# Patient Record
Sex: Female | Born: 1935 | Race: Black or African American | Hispanic: No | Smoking: Never smoker
Health system: Southern US, Community
[De-identification: ages and names within clinical notes are randomized; demographics above are authoritative.]

## PROBLEM LIST (undated history)

## (undated) DIAGNOSIS — R519 Headache, unspecified: Secondary | ICD-10-CM

## (undated) DIAGNOSIS — H269 Unspecified cataract: Secondary | ICD-10-CM

## (undated) DIAGNOSIS — M129 Arthropathy, unspecified: Secondary | ICD-10-CM

## (undated) DIAGNOSIS — M1990 Unspecified osteoarthritis, unspecified site: Secondary | ICD-10-CM

## (undated) DIAGNOSIS — I10 Essential (primary) hypertension: Secondary | ICD-10-CM

## (undated) DIAGNOSIS — E669 Obesity, unspecified: Secondary | ICD-10-CM

## (undated) DIAGNOSIS — R51 Headache: Secondary | ICD-10-CM

## (undated) DIAGNOSIS — D649 Anemia, unspecified: Secondary | ICD-10-CM

## (undated) DIAGNOSIS — J00 Acute nasopharyngitis [common cold]: Secondary | ICD-10-CM

## (undated) DIAGNOSIS — J309 Allergic rhinitis, unspecified: Secondary | ICD-10-CM

## (undated) HISTORY — DX: Unspecified cataract: H26.9

## (undated) HISTORY — DX: Allergic rhinitis, unspecified: J30.9

## (undated) HISTORY — PX: EYE SURGERY: SHX253

## (undated) HISTORY — PX: CATARACT EXTRACTION: SUR2

## (undated) HISTORY — DX: Arthropathy, unspecified: M12.9

## (undated) HISTORY — DX: Unspecified osteoarthritis, unspecified site: M19.90

## (undated) HISTORY — DX: Acute nasopharyngitis (common cold): J00

## (undated) HISTORY — DX: Obesity, unspecified: E66.9

## (undated) HISTORY — DX: Essential (primary) hypertension: I10

---

## 1968-11-25 HISTORY — PX: ABDOMINAL HYSTERECTOMY: SHX81

## 2005-10-17 ENCOUNTER — Emergency Department: Payer: Self-pay | Admitting: Emergency Medicine

## 2006-09-19 ENCOUNTER — Other Ambulatory Visit: Payer: Self-pay

## 2006-09-19 ENCOUNTER — Ambulatory Visit: Payer: Self-pay | Admitting: Ophthalmology

## 2006-09-24 ENCOUNTER — Ambulatory Visit: Payer: Self-pay | Admitting: Ophthalmology

## 2007-05-02 ENCOUNTER — Emergency Department: Payer: Self-pay | Admitting: Emergency Medicine

## 2007-05-02 ENCOUNTER — Other Ambulatory Visit: Payer: Self-pay

## 2008-05-14 ENCOUNTER — Emergency Department: Payer: Self-pay | Admitting: Internal Medicine

## 2008-05-14 ENCOUNTER — Other Ambulatory Visit: Payer: Self-pay

## 2009-01-05 ENCOUNTER — Ambulatory Visit: Payer: Self-pay | Admitting: Family

## 2011-01-01 ENCOUNTER — Emergency Department: Payer: Self-pay | Admitting: Emergency Medicine

## 2011-08-07 ENCOUNTER — Other Ambulatory Visit: Payer: Self-pay | Admitting: Internal Medicine

## 2011-08-08 MED ORDER — CELECOXIB 100 MG PO CAPS: 100.0000 mg | ORAL_CAPSULE | Freq: Two times a day (BID) | ORAL | Status: AC

## 2011-09-06 ENCOUNTER — Encounter: Payer: Self-pay | Admitting: Internal Medicine

## 2012-04-01 ENCOUNTER — Ambulatory Visit: Payer: Self-pay | Admitting: Internal Medicine

## 2012-10-13 ENCOUNTER — Ambulatory Visit: Payer: Self-pay | Admitting: Internal Medicine

## 2012-10-16 DIAGNOSIS — R9431 Abnormal electrocardiogram [ECG] [EKG]: Secondary | ICD-10-CM | POA: Insufficient documentation

## 2013-01-29 ENCOUNTER — Emergency Department: Payer: Self-pay | Admitting: Emergency Medicine

## 2013-01-29 LAB — COMPREHENSIVE METABOLIC PANEL
Anion Gap: 4 — ABNORMAL LOW (ref 7–16)
BUN: 13 mg/dL (ref 7–18)
Bilirubin,Total: 0.3 mg/dL (ref 0.2–1.0)
Calcium, Total: 8.8 mg/dL (ref 8.5–10.1)
Chloride: 110 mmol/L — ABNORMAL HIGH (ref 98–107)
Creatinine: 0.6 mg/dL (ref 0.60–1.30)
Glucose: 120 mg/dL — ABNORMAL HIGH (ref 65–99)
Osmolality: 281 (ref 275–301)
Potassium: 3.7 mmol/L (ref 3.5–5.1)
SGOT(AST): 19 U/L (ref 15–37)
SGPT (ALT): 18 U/L (ref 12–78)
Total Protein: 7.7 g/dL (ref 6.4–8.2)

## 2013-01-29 LAB — URINALYSIS, COMPLETE
Bacteria: NONE SEEN
Glucose,UR: NEGATIVE mg/dL (ref 0–75)
Nitrite: NEGATIVE
Specific Gravity: 1.023 (ref 1.003–1.030)
WBC UR: 2 /HPF (ref 0–5)

## 2013-01-29 LAB — CBC
HCT: 35.9 % (ref 35.0–47.0)
HGB: 12.5 g/dL (ref 12.0–16.0)
MCH: 33.2 pg (ref 26.0–34.0)
RBC: 3.76 10*6/uL — ABNORMAL LOW (ref 3.80–5.20)

## 2013-01-29 LAB — TROPONIN I: Troponin-I: <0.02 ng/mL

## 2013-03-05 ENCOUNTER — Ambulatory Visit: Payer: Self-pay | Admitting: Internal Medicine

## 2013-03-10 DIAGNOSIS — R011 Cardiac murmur, unspecified: Secondary | ICD-10-CM | POA: Insufficient documentation

## 2013-06-13 ENCOUNTER — Emergency Department: Payer: Self-pay | Admitting: Emergency Medicine

## 2013-06-13 LAB — COMPREHENSIVE METABOLIC PANEL
Albumin: 3.8 g/dL (ref 3.4–5.0)
Anion Gap: 5 — ABNORMAL LOW (ref 7–16)
BUN: 20 mg/dL — ABNORMAL HIGH (ref 7–18)
Calcium, Total: 9.2 mg/dL (ref 8.5–10.1)
Chloride: 111 mmol/L — ABNORMAL HIGH (ref 98–107)
Creatinine: 0.75 mg/dL (ref 0.60–1.30)
Potassium: 3.8 mmol/L (ref 3.5–5.1)
SGOT(AST): 15 U/L (ref 15–37)
Total Protein: 8 g/dL (ref 6.4–8.2)

## 2013-06-13 LAB — CBC
HGB: 11.7 g/dL — ABNORMAL LOW (ref 12.0–16.0)
MCH: 30.3 pg (ref 26.0–34.0)
MCHC: 32.5 g/dL (ref 32.0–36.0)
MCV: 93 fL (ref 80–100)
Platelet: 233 10*3/uL (ref 150–440)
WBC: 7.2 10*3/uL (ref 3.6–11.0)

## 2013-06-13 LAB — URINALYSIS, COMPLETE
Bacteria: NONE SEEN
Ph: 6 (ref 4.5–8.0)
Squamous Epithelial: 3

## 2013-06-13 LAB — TROPONIN I: Troponin-I: <0.02 ng/mL

## 2013-08-05 ENCOUNTER — Ambulatory Visit: Payer: Self-pay | Admitting: Internal Medicine

## 2015-01-11 ENCOUNTER — Ambulatory Visit: Payer: Self-pay | Admitting: Ophthalmology

## 2015-01-11 HISTORY — PX: CATARACT EXTRACTION: SUR2

## 2015-02-08 ENCOUNTER — Ambulatory Visit: Payer: Self-pay | Admitting: Orthopedic Surgery

## 2015-03-22 ENCOUNTER — Ambulatory Visit: Payer: Self-pay

## 2015-03-22 ENCOUNTER — Ambulatory Visit
Admit: 2015-03-22 | Disposition: A | Discharge status: Home / Self Care | Payer: Self-pay | Attending: Orthopedic Surgery | Admitting: Orthopedic Surgery

## 2015-03-22 LAB — URINALYSIS, COMPLETE
BILIRUBIN, UR: NEGATIVE
Blood: NEGATIVE
Glucose,UR: NEGATIVE mg/dL (ref 0–75)
Ketone: NEGATIVE
Nitrite: NEGATIVE
Ph: 6 (ref 4.5–8.0)
Protein: NEGATIVE
Specific Gravity: 1.017 (ref 1.003–1.030)

## 2015-03-22 LAB — CBC
HCT: 36.4 % (ref 35.0–47.0)
HGB: 12.5 g/dL (ref 12.0–16.0)
MCH: 32.7 pg (ref 26.0–34.0)
MCHC: 34.4 g/dL (ref 32.0–36.0)
MCV: 95 fL (ref 80–100)
PLATELETS: 199 10*3/uL (ref 150–440)
RBC: 3.83 10*6/uL (ref 3.80–5.20)
RDW: 14.3 % (ref 11.5–14.5)
WBC: 4.6 10*3/uL (ref 3.6–11.0)

## 2015-03-22 LAB — SEDIMENTATION RATE: Erythrocyte Sed Rate: 19 mm/hr (ref 0–30)

## 2015-03-22 LAB — PROTIME-INR
INR: 1
Prothrombin Time: 13.3 secs

## 2015-03-22 LAB — APTT: Activated PTT: 32.6 secs (ref 23.6–35.9)

## 2015-03-22 LAB — MRSA PCR SCREENING

## 2015-03-24 LAB — URINE CULTURE

## 2015-04-06 ENCOUNTER — Inpatient Hospital Stay: Payer: Medicare Other | Admitting: Anesthesiology

## 2015-04-06 ENCOUNTER — Inpatient Hospital Stay
Admission: RE | Admit: 2015-04-06 | Discharge: 2015-04-10 | DRG: 470 | Disposition: A | Discharge status: Skilled Nursing Facility | Payer: Medicare Other | Source: Ambulatory Visit | Attending: Orthopedic Surgery | Admitting: Orthopedic Surgery

## 2015-04-06 ENCOUNTER — Encounter
Admission: RE | Disposition: A | Discharge status: Skilled Nursing Facility | Payer: Self-pay | Source: Ambulatory Visit | Attending: Orthopedic Surgery

## 2015-04-06 ENCOUNTER — Encounter: Payer: Self-pay | Admitting: *Deleted

## 2015-04-06 ENCOUNTER — Inpatient Hospital Stay: Payer: Medicare Other

## 2015-04-06 DIAGNOSIS — I10 Essential (primary) hypertension: Secondary | ICD-10-CM | POA: Diagnosis present

## 2015-04-06 DIAGNOSIS — Z96659 Presence of unspecified artificial knee joint: Secondary | ICD-10-CM

## 2015-04-06 DIAGNOSIS — M1711 Unilateral primary osteoarthritis, right knee: Principal | ICD-10-CM | POA: Diagnosis present

## 2015-04-06 DIAGNOSIS — M1710 Unilateral primary osteoarthritis, unspecified knee: Secondary | ICD-10-CM | POA: Diagnosis present

## 2015-04-06 HISTORY — PX: TOTAL KNEE ARTHROPLASTY: SHX125

## 2015-04-06 LAB — CBC
HCT: 33.5 % — ABNORMAL LOW (ref 35.0–47.0)
Hemoglobin: 10.9 g/dL — ABNORMAL LOW (ref 12.0–16.0)
MCH: 31.3 pg (ref 26.0–34.0)
MCHC: 32.7 g/dL (ref 32.0–36.0)
MCV: 95.8 fL (ref 80.0–100.0)
Platelets: 201 10*3/uL (ref 150–440)
RBC: 3.5 MIL/uL — ABNORMAL LOW (ref 3.80–5.20)
RDW: 14.1 % (ref 11.5–14.5)
WBC: 8.2 10*3/uL (ref 3.6–11.0)

## 2015-04-06 LAB — CREATININE, SERUM
Creatinine, Ser: 0.66 mg/dL (ref 0.44–1.00)
GFR calc Af Amer: >60 mL/min (ref >60)
GFR calc non Af Amer: >60 mL/min (ref >60)

## 2015-04-06 LAB — TYPE AND SCREEN
ABO/RH(D): A POS
Antibody Screen: NEGATIVE

## 2015-04-06 LAB — ABO/RH: ABO/RH(D): A POS

## 2015-04-06 SURGERY — ARTHROPLASTY, KNEE, TOTAL
Anesthesia: Spinal | Laterality: Right

## 2015-04-06 MED ORDER — PROPOFOL 10 MG/ML IV BOLUS
INTRAVENOUS | Status: DC | PRN
  Administered 2015-04-06: 20 mg via INTRAVENOUS

## 2015-04-06 MED ORDER — FENTANYL CITRATE (PF) 100 MCG/2ML IJ SOLN: 25.0000 ug | INTRAMUSCULAR | Status: DC | PRN

## 2015-04-06 MED ORDER — CLINDAMYCIN PHOSPHATE 900 MG/50ML IV SOLN
900.0000 mg | Freq: Four times a day (QID) | INTRAVENOUS | Status: AC
  Administered 2015-04-06 – 2015-04-07 (×4): 900 mg via INTRAVENOUS
  Filled 2015-04-06 (×4): qty 50

## 2015-04-06 MED ORDER — FAMOTIDINE 20 MG PO TABS
20.0000 mg | ORAL_TABLET | Freq: Once | ORAL | Status: AC
  Administered 2015-04-06: 20 mg via ORAL

## 2015-04-06 MED ORDER — ONDANSETRON HCL 4 MG/2ML IJ SOLN: 4.0000 mg | Freq: Four times a day (QID) | INTRAMUSCULAR | Status: DC | PRN

## 2015-04-06 MED ORDER — FAMOTIDINE 20 MG PO TABS
ORAL_TABLET | ORAL | Status: AC
  Administered 2015-04-06: 20 mg via ORAL
  Filled 2015-04-06: qty 1

## 2015-04-06 MED ORDER — SODIUM CHLORIDE 0.9 % IV SOLN
INTRAVENOUS | Status: DC | PRN
  Administered 2015-04-06: 60 mL

## 2015-04-06 MED ORDER — BENAZEPRIL HCL 20 MG PO TABS
40.0000 mg | ORAL_TABLET | Freq: Every day | ORAL | Status: DC
  Administered 2015-04-07 – 2015-04-10 (×3): 40 mg via ORAL
  Filled 2015-04-06 (×4): qty 2

## 2015-04-06 MED ORDER — KETAMINE HCL 50 MG/ML IJ SOLN
INTRAMUSCULAR | Status: DC | PRN
  Administered 2015-04-06: 25 mg via INTRAMUSCULAR

## 2015-04-06 MED ORDER — ONDANSETRON HCL 4 MG PO TABS: 4.0000 mg | ORAL_TABLET | Freq: Four times a day (QID) | ORAL | Status: DC | PRN

## 2015-04-06 MED ORDER — MENTHOL 3 MG MT LOZG
1.0000 | LOZENGE | OROMUCOSAL | Status: DC | PRN
  Filled 2015-04-06: qty 9

## 2015-04-06 MED ORDER — SODIUM CHLORIDE 0.9 % IJ SOLN
INTRAMUSCULAR | Status: AC
  Filled 2015-04-06: qty 100

## 2015-04-06 MED ORDER — ONDANSETRON HCL 4 MG/2ML IJ SOLN: 4.0000 mg | Freq: Once | INTRAMUSCULAR | Status: DC | PRN

## 2015-04-06 MED ORDER — MORPHINE SULFATE 2 MG/ML IJ SOLN
2.0000 mg | INTRAMUSCULAR | Status: DC | PRN
  Administered 2015-04-06: 2 mg via INTRAVENOUS
  Administered 2015-04-06: 4 mg via INTRAVENOUS
  Administered 2015-04-07: 2 mg via INTRAVENOUS
  Administered 2015-04-07: 4 mg via INTRAVENOUS
  Administered 2015-04-07: 2 mg via INTRAVENOUS
  Filled 2015-04-06 (×2): qty 1
  Filled 2015-04-06 (×2): qty 2
  Filled 2015-04-06: qty 1

## 2015-04-06 MED ORDER — PROPOFOL INFUSION 10 MG/ML OPTIME
INTRAVENOUS | Status: DC | PRN
  Administered 2015-04-06: 25 ug/kg/min via INTRAVENOUS

## 2015-04-06 MED ORDER — CLINDAMYCIN PHOSPHATE 900 MG/50ML IV SOLN
INTRAVENOUS | Status: AC
  Administered 2015-04-06: 900 mg via INTRAVENOUS
  Filled 2015-04-06: qty 50

## 2015-04-06 MED ORDER — PHENOL 1.4 % MT LIQD
1.0000 | OROMUCOSAL | Status: DC | PRN
  Filled 2015-04-06: qty 177

## 2015-04-06 MED ORDER — CELECOXIB 100 MG PO CAPS
100.0000 mg | ORAL_CAPSULE | Freq: Two times a day (BID) | ORAL | Status: DC
  Administered 2015-04-06 – 2015-04-10 (×9): 100 mg via ORAL
  Filled 2015-04-06 (×10): qty 1

## 2015-04-06 MED ORDER — LACTATED RINGERS IV SOLN
INTRAVENOUS | Status: DC
  Administered 2015-04-06 (×3): via INTRAVENOUS

## 2015-04-06 MED ORDER — BUPIVACAINE-EPINEPHRINE (PF) 0.25% -1:200000 IJ SOLN
INTRAMUSCULAR | Status: AC
  Filled 2015-04-06: qty 30

## 2015-04-06 MED ORDER — ACETAMINOPHEN 325 MG PO TABS: 650.0000 mg | ORAL_TABLET | Freq: Four times a day (QID) | ORAL | Status: DC | PRN

## 2015-04-06 MED ORDER — NEOMYCIN-POLYMYXIN B GU 40-200000 IR SOLN
Status: AC
  Filled 2015-04-06: qty 20

## 2015-04-06 MED ORDER — ACETAMINOPHEN 650 MG RE SUPP: 650.0000 mg | Freq: Four times a day (QID) | RECTAL | Status: DC | PRN

## 2015-04-06 MED ORDER — TRANEXAMIC ACID 1000 MG/10ML IV SOLN: 500.0000 mg | Freq: Once | INTRAVENOUS | Status: DC

## 2015-04-06 MED ORDER — NEOMYCIN-POLYMYXIN B GU 40-200000 IR SOLN
Status: DC | PRN
  Administered 2015-04-06: 16 mL

## 2015-04-06 MED ORDER — ENOXAPARIN SODIUM 30 MG/0.3ML ~~LOC~~ SOLN
30.0000 mg | SUBCUTANEOUS | Status: DC
  Administered 2015-04-07 – 2015-04-08 (×2): 30 mg via SUBCUTANEOUS
  Filled 2015-04-06 (×2): qty 0.3

## 2015-04-06 MED ORDER — MORPHINE SULFATE 10 MG/ML IJ SOLN
INTRAMUSCULAR | Status: AC
  Filled 2015-04-06: qty 1

## 2015-04-06 MED ORDER — MIDAZOLAM HCL 5 MG/5ML IJ SOLN
INTRAMUSCULAR | Status: DC | PRN
  Administered 2015-04-06: 2 mg via INTRAVENOUS

## 2015-04-06 MED ORDER — BUPIVACAINE HCL (PF) 0.5 % IJ SOLN
INTRAMUSCULAR | Status: DC | PRN
  Administered 2015-04-06: 2.8 mL

## 2015-04-06 MED ORDER — AMLODIPINE BESY-BENAZEPRIL HCL 10-40 MG PO CAPS: 1.0000 | ORAL_CAPSULE | Freq: Every day | ORAL | Status: DC

## 2015-04-06 MED ORDER — NON FORMULARY
Status: DC | PRN
  Administered 2015-04-06: 60 mL via INTRAMUSCULAR

## 2015-04-06 MED ORDER — AMLODIPINE BESYLATE 10 MG PO TABS
10.0000 mg | ORAL_TABLET | Freq: Every day | ORAL | Status: DC
  Administered 2015-04-07 – 2015-04-10 (×4): 10 mg via ORAL
  Filled 2015-04-06 (×4): qty 1

## 2015-04-06 MED ORDER — DEXTROSE-NACL 5-0.9 % IV SOLN
INTRAVENOUS | Status: DC
  Administered 2015-04-06 – 2015-04-07 (×3): via INTRAVENOUS

## 2015-04-06 MED ORDER — SODIUM CHLORIDE 0.9 % IV SOLN
1000.0000 mg | INTRAVENOUS | Status: AC
Start: 2015-04-06 — End: 2015-04-06
  Administered 2015-04-06: 1000 mg via INTRAVENOUS
  Filled 2015-04-06: qty 10

## 2015-04-06 MED ORDER — CLINDAMYCIN PHOSPHATE 900 MG/50ML IV SOLN: 900.0000 mg | Freq: Once | INTRAVENOUS | Status: DC

## 2015-04-06 MED ORDER — OXYCODONE HCL 5 MG PO TABS
5.0000 mg | ORAL_TABLET | ORAL | Status: DC | PRN
  Administered 2015-04-06: 5 mg via ORAL
  Administered 2015-04-07 (×2): 10 mg via ORAL
  Administered 2015-04-07 (×2): 5 mg via ORAL
  Filled 2015-04-06: qty 2
  Filled 2015-04-06 (×2): qty 1
  Filled 2015-04-06: qty 2
  Filled 2015-04-06: qty 1

## 2015-04-06 MED FILL — Bupivacaine Inj 0.25% w/ Epinephrine 1:200000 (PF): INTRAMUSCULAR | Qty: 30 | Status: AC

## 2015-04-06 MED FILL — Ketorolac Tromethamine Inj 30 MG/ML: INTRAMUSCULAR | Qty: 1 | Status: AC

## 2015-04-06 MED FILL — Morphine Sulfate Inj 10 MG/ML: INTRAMUSCULAR | Qty: 1 | Status: AC

## 2015-04-06 MED FILL — Sodium Chloride Inj 0.9%: INTRAMUSCULAR | Qty: 50 | Status: AC

## 2015-04-06 SURGICAL SUPPLY — 54 items
BANDAGE ELASTIC 4 CLIP ST LF (GAUZE/BANDAGES/DRESSINGS) ×3 IMPLANT
BANDAGE ELASTIC 6 CLIP ST LF (GAUZE/BANDAGES/DRESSINGS) ×3 IMPLANT
BLADE SAW 1 (BLADE) ×3 IMPLANT
BLOCK CUTTING TIBIAL 2 RT (MISCELLANEOUS) ×3 IMPLANT
CANISTER SUCT 1200ML W/VALVE (MISCELLANEOUS) ×3 IMPLANT
CANISTER SUCT 3000ML (MISCELLANEOUS) ×6 IMPLANT
CAPT KNEE TOTAL 3 ×3 IMPLANT
CAST PADDING 3X4FT ST 30246 (SOFTGOODS) ×2
CATH FOL LEG HOLDER (MISCELLANEOUS) ×3 IMPLANT
CATH TRAY 16F METER LATEX (MISCELLANEOUS) ×3 IMPLANT
CEMENT HV SMART SET (Cement) ×6 IMPLANT
CHLORAPREP W/TINT 26ML (MISCELLANEOUS) ×6 IMPLANT
COOLER POLAR GLACIER W/PUMP (MISCELLANEOUS) ×3 IMPLANT
DECANTER SPIKE VIAL GLASS SM (MISCELLANEOUS) ×9 IMPLANT
DRAPE INCISE IOBAN 66X45 STRL (DRAPES) ×6 IMPLANT
DRAPE SHEET LG 3/4 BI-LAMINATE (DRAPES) ×6 IMPLANT
ELECT CAUTERY BLADE 6.4 (BLADE) ×3 IMPLANT
FEMUR CUTTING BLOCK ×3 IMPLANT
GAUZE PETRO XEROFOAM 1X8 (MISCELLANEOUS) ×3 IMPLANT
GAUZE SPONGE 4X4 12PLY STRL (GAUZE/BANDAGES/DRESSINGS) ×3 IMPLANT
GAUZE XEROFORM 4X4 STRL (GAUZE/BANDAGES/DRESSINGS) ×3 IMPLANT
GLOVE BIOGEL PI IND STRL 9 (GLOVE) ×2 IMPLANT
GLOVE BIOGEL PI INDICATOR 9 (GLOVE) ×4
GLOVE SURG ORTHO 9.0 STRL STRW (GLOVE) ×3 IMPLANT
GOWN SPECIALTY ULTRA XL (MISCELLANEOUS) ×3 IMPLANT
GOWN STRL REUS W/ TWL LRG LVL3 (GOWN DISPOSABLE) ×2 IMPLANT
GOWN STRL REUS W/TWL LRG LVL3 (GOWN DISPOSABLE) ×4
HANDPIECE SUCTION TUBG SURGILV (MISCELLANEOUS) ×3 IMPLANT
HOOD PEEL AWAY FACE SHEILD DIS (HOOD) ×6 IMPLANT
IMMBOLIZER KNEE 19 BLUE UNIV (SOFTGOODS) ×3 IMPLANT
IV SET EXTENSION 6 LL TADAPT (SET/KITS/TRAYS/PACK) ×3 IMPLANT
KNEE MEDACTA TIBIAL/FEMORAL BL (Knees) ×3 IMPLANT
KNIFE SCULPS 14X20 (INSTRUMENTS) ×3 IMPLANT
NDL SAFETY 18GX1.5 (NEEDLE) ×3 IMPLANT
NEEDLE SPNL 18GX3.5 QUINCKE PK (NEEDLE) ×3 IMPLANT
NEEDLE SPNL 20GX3.5 QUINCKE YW (NEEDLE) ×3 IMPLANT
NS IRRIG 1000ML POUR BTL (IV SOLUTION) ×3 IMPLANT
PACK TOTAL KNEE (MISCELLANEOUS) ×3 IMPLANT
PAD CAST CTTN 3X4 STRL (SOFTGOODS) ×1 IMPLANT
PAD GROUND ADULT SPLIT (MISCELLANEOUS) ×3 IMPLANT
SOL .9 NS 3000ML IRR  AL (IV SOLUTION) ×2
SOL .9 NS 3000ML IRR UROMATIC (IV SOLUTION) ×1 IMPLANT
STAPLER SKIN PROX 35W (STAPLE) ×3 IMPLANT
STEM EXTENSION 11MMX30MM (Stem) ×3 IMPLANT
STRAP SAFETY BODY (MISCELLANEOUS) ×3 IMPLANT
SUCTION FRAZIER TIP 10 FR DISP (SUCTIONS) ×3 IMPLANT
SUT DVC 2 QUILL PDO  T11 36X36 (SUTURE) ×2
SUT DVC 2 QUILL PDO T11 36X36 (SUTURE) ×1 IMPLANT
SUT DVC QUILL MONODERM 30X30 (SUTURE) ×3 IMPLANT
SUT ETHIBOND NAB CT1 #1 30IN (SUTURE) ×3 IMPLANT
SYR 20CC LL (SYRINGE) ×3 IMPLANT
SYR 50ML LL SCALE MARK (SYRINGE) ×3 IMPLANT
TOWER CARTRIDGE SMART MIX (DISPOSABLE) ×3 IMPLANT
WATER STERILE IRR 1000ML POUR (IV SOLUTION) ×3 IMPLANT

## 2015-04-06 NOTE — Anesthesia Preprocedure Evaluation (Signed)
Anesthesia Evaluation  Patient identified by MRN, date of birth, ID band Patient awake    Reviewed: Allergy & Precautions, Patient's Chart, lab work & pertinent test results, reviewed documented beta blocker date and time   Airway Mallampati: II  TM Distance: >3 FB Neck ROM: Full    Dental  (+) Upper Dentures   Pulmonary          Cardiovascular hypertension,     Neuro/Psych    GI/Hepatic   Endo/Other    Renal/GU      Musculoskeletal  (+) Arthritis -,   Abdominal   Peds  Hematology   Anesthesia Other Findings   Reproductive/Obstetrics                             Anesthesia Physical Anesthesia Plan  ASA: II  Anesthesia Plan: Spinal   Post-op Pain Management:    Induction:   Airway Management Planned: Nasal Cannula  Additional Equipment:   Intra-op Plan:   Post-operative Plan:   Informed Consent: I have reviewed the patients History and Physical, chart, labs and discussed the procedure including the risks, benefits and alternatives for the proposed anesthesia with the patient or authorized representative who has indicated his/her understanding and acceptance.     Plan Discussed with: CRNA  Anesthesia Plan Comments:         Anesthesia Quick Evaluation

## 2015-04-06 NOTE — Anesthesia Postprocedure Evaluation (Signed)
  Anesthesia Post-op Note  Patient: Whitney Mcneil  Procedure(s) Performed: Procedure(s): TOTAL KNEE ARTHROPLASTY (Right)  Anesthesia type:Spinal  Patient location: PACU  Post pain: Pain level controlled  Post assessment: Post-op Vital signs reviewed, Patient's Cardiovascular Status Stable, Respiratory Function Stable, Patent Airway and No signs of Nausea or vomiting  Post vital signs: Reviewed and stable  Last Vitals:  Filed Vitals:   04/06/15 1118  BP: 138/67  Pulse: 68  Temp: 35.8 C  Resp: 16    Level of consciousness: awake, alert  and patient cooperative  Complications: No apparent anesthesia complications

## 2015-04-06 NOTE — H&P (Signed)
Reviewed paper H+P, will be scanned into chart. No changes noted.  

## 2015-04-06 NOTE — Anesthesia Procedure Notes (Addendum)
Date/Time: 04/06/2015 7:23 AM Performed by: Junious SilkNOLES, Whitney Altamura Pre-anesthesia Checklist: Patient identified, Emergency Drugs available, Suction available, Patient being monitored and Timeout performed Oxygen Delivery Method: Simple face mask   Spinal Patient location during procedure: OR Start time: 04/06/2015 7:15 AM End time: 04/06/2015 7:20 AM Staffing Resident/CRNA: Junious SilkNOLES, Whitney Mcneil Performed by: resident/CRNA  Preanesthetic Checklist Completed: patient identified, site marked, surgical consent, pre-op evaluation, timeout performed, IV checked, risks and benefits discussed and monitors and equipment checked Spinal Block Patient position: sitting Prep: Betadine Patient monitoring: heart rate, continuous pulse ox and blood pressure Approach: midline Location: L3-4 Injection technique: single-shot Needle Needle type: Whitacre  Needle gauge: 24 G Needle length: 9 cm Assessment Sensory level: T10 Additional Notes Sterile prep and drape.  3ml 1% lido to l3-l4 area.  Introducer and spinal needle to SA space +csf return (-) parathesia (-) blood return  Inject 2.458ml 0.5% marcaine + epi wash.  Removed introducer and spinal needle intact.  Block successful to T10.  No complications.  Patient tolerated well.

## 2015-04-06 NOTE — Transfer of Care (Signed)
Immediate Anesthesia Transfer of Care Note  Patient: Whitney Mcneil  Procedure(s) Performed: Procedure(s): TOTAL KNEE ARTHROPLASTY (Right)  Patient Location: PACU  Anesthesia Type:Spinal  Level of Consciousness: sedated  Airway & Oxygen Therapy: Patient Spontanous Breathing and Patient connected to face mask oxygen  Post-op Assessment: Report given to RN  Post vital signs: stable  Last Vitals:  Filed Vitals:   04/06/15 0626  BP: 158/62  Pulse: 78  Temp: 36.9 C  Resp: 20    Complications: No apparent anesthesia complications

## 2015-04-06 NOTE — Transfer of Care (Signed)
Immediate Anesthesia Transfer of Care Note  Patient: Whitney Mcneil  Procedure(s) Performed: Procedure(s): TOTAL KNEE ARTHROPLASTY (Right)  Patient Location: PACU  Anesthesia Type:Spinal  Level of Consciousness: sedated  Airway & Oxygen Therapy: Patient Spontanous Breathing  Post-op Assessment: Report given to RN  Post vital signs: stable  Last Vitals:  Filed Vitals:   04/06/15 0626  BP: 158/62  Pulse: 78  Temp: 36.9 C  Resp: 20    Complications: No apparent anesthesia complications

## 2015-04-06 NOTE — OR Nursing (Signed)
Spinal Time out:0718  Betadine soak usedX5 mins-MD performed

## 2015-04-06 NOTE — Progress Notes (Signed)
Received pt from PACU.

## 2015-04-06 NOTE — Op Note (Signed)
04/06/2015  10:15 AM  PATIENT:  Whitney Mcneil  81 y.o. female  PRE-OPERATIVE DIAGNOSIS:  Knee Degenerative joint disease  POST-OPERATIVE DIAGNOSIS:  same  PROCEDURE:  Procedure(s): TOTAL KNEE ARTHROPLASTY (Right)  SURGEON: Leitha SchullerMichael J Skeeter Sheard, MD  ASSISTANTS: Cranston Neighborhris Gaines [facility]   ANESTHESIA:   spinal  EBL:  Total I/O In: 1300 [I.V.:1300] Out: 300 [Urine:100; Blood:200]  BLOOD ADMINISTERED:none  DRAINS: none   LOCAL MEDICATIONS USED:  MARCAINE    and OTHER Toradol morphine and Experel  SPECIMEN:  Source of Specimen:  Cut ends of bone  DISPOSITION OF SPECIMEN:  PATHOLOGY  COUNTS:  YES  TOURNIQUET:   Total Tourniquet Time Documented: Thigh (Right) - 40 minutes Total: Thigh (Right) - 40 minutes   IMPLANTS: Medacta GMK sphere 2+ femur, 2 tibia with stem, 14 mm high flex insert, to patella all cemented  DICTATION: .Dragon Dictation  patient was brought to the operating room for right total knee replacement. After spinal anesthesia was obtained she was transfused she was placed on the operative table in a supine position. The right leg was prepped draped in the usual sterile fashion with a tourniquet to the upper thigh. After patient identification and timeout procedures were completed, a midline skin incision was made with the knee in flexion. Medial parapatellar arthrotomy is performed, and the knee entered. Inspection revealed severe tricompartmental osteoarthritis with extensive osteophytes and eburnated bone in the medial and lateral compartments with significant bone loss posterior medial. Patella had extensive osteophytes as well. Axial tibia was exposed for application of the Medacta cutting guide. Proximal tibia cut was carried out. The femoral cutting guide was then applied and the distal cut carried out with 2+ 4-in-1 cutting guide placed anterior posterior and chamfer cuts made. With regard to excision of the posterior horns of the menisci residual posterior tibia  osteophytes removed off the at posterior aspect of the medial and lateral femoral condyles. Tibial baseplate was applied and prepared for the keel punch with a short stem. The short stem trial was placed and the femoral trial then placed on the distal femur. A 14 mm insert gave excellent stability and range of motion was chosen for final components the distal femoral drill holes were made and trial components removed. The patella was then cut using the patellar cutting guide drilled and measured a size 2. At this point the bony surfaces were irrigated and local anesthetic applied. A mixture of morphine Toradol and have percent Sensorcaine infiltrated in the periarticular tissues along with Exparel in diluted with saline. The tourniquet was released at this point in the bony surfaces thoroughly irrigated and dried. The tibial implant was placed first with cement excess cement being removed. Femoral component was applied after insertion of the polyethylene insert with set screw. The patellar button was applied with ALL components cemented with a patellar clamp applied the knee was held in extension until the cement was set a dilute solution of Betadine was used then to irrigate out the knee. The knee was checked Frain loose cement or excess cement was removed and he was irrigated with pulsatile lavage.The knee was then closed with a heavy Quill for the capsule to a Quill subcutaneously and skin staples range of motion was 0-110 with good stability. Xeroform 4 x 4's ABDs and web roll Polar Care and Ace wrap were applied and the patient sent to recovery room in stable condition  PLAN OF CARE: Admit to inpatient   PATIENT DISPOSITION:  PACU - hemodynamically stable.

## 2015-04-06 NOTE — Brief Op Note (Signed)
04/06/2015  9:47 AM  PATIENT:  Whitney Mcneil  81 y.o. female  PRE-OPERATIVE DIAGNOSIS:  Knee Degenerative joint disease  POST-OPERATIVE DIAGNOSIS:  same  PROCEDURE:  Procedure(s): TOTAL KNEE ARTHROPLASTY (Right)  SURGEON:  Surgeon(s) and Role:    * Kennedy BuckerMichael Keni Elison, MD - Primary  PHYSICIAN ASSISTANT:   ASSISTANTS: Cran[place] Neighborhris Gaines [facility]   ANESTHESIA:   spinal  EBL:  Total I/O In: 1300 [I.V.:1300] Out: 300 [Urine:100; Blood:200]  BLOOD ADMINISTERED:none  DRAINS: none   LOCAL MEDICATIONS USED:  MARCAINE    and OTHER Toradol morphine and Exparel  SPECIMEN:  Source of Specimen:  Cut ends of bone  DISPOSITION OF SPECIMEN:  PATHOLOGY  COUNTS:  YES  TOURNIQUET:   Total Tourniquet Time Documented: Thigh (Right) - 40 minutes Total: Thigh (Right) - 40 minutes   DICTATION: .Reubin Milanragon Dictation  PLAN OF CARE: Admit to inpatient   PATIENT DISPOSITION:  PACU - hemodynamically stable.   Delay start of Pharmacological VTE agent (>24hrs) due to surgical blood loss or risk of bleeding: no

## 2015-04-07 NOTE — Evaluation (Signed)
Occupational Therapy Evaluation Patient Details Name: Whitney RidgesCornelia Mcneil MRN: [id number] DOB: 07/02/1933 Today's Date: 04/07/2015    History of Present Illness 81 y/o black female admitted with degenerative joint disease and s/p R TKA.  Pt lives alone but has a lot of family support and would like to go [facility].     Clinical Impression   Pt seen while sitting up in chair and needs moderate assist for LB dressing due to pain and decreased AROM of RLE.  She lives alone and her husband is deceased but has family around to assist as needed.  She would benefit from skilled OT services to increase independence in ADLs back to PLOF with AD, functional mobility training and education in home modifications to increase safety and prevent falls.  She would be a good candidate for SNF and [facility].    Follow Up Recommendations  SNF    Equipment Recommendations       Recommendations for Other Services PT consult     Precautions / Restrictions Precautions Precautions: Fall Restrictions Weight Bearing Restrictions: Yes RLE Weight Bearing: Weight bearing as tolerated      Mobility Bed Mobility                  Transfers                      Balance                                            ADL Overall ADL's : Needs assistance/impaired Eating/Feeding: Independent   Grooming: Wash/dry hands;Wash/dry face;Oral care;Applying deodorant;Brushing hair;Independent   Upper Body Bathing: Independent   Lower Body Bathing: Moderate assistance   Upper Body Dressing : Independent   Lower Body Dressing: Moderate assistance                       Vision     Perception     Praxis      Pertinent Vitals/Pain Pain Assessment: 0-10 Pain Score: 7  Pain Location: R knee Pain Descriptors / Indicators: Aching;Constant Pain Intervention(s): Limited activity within patient's tolerance;Premedicated before session;Patient  requesting pain meds-RN notified     Hand Dominance Right   Extremity/Trunk Assessment Upper Extremity Assessment Upper Extremity Assessment: Overall WFL for tasks assessed   Lower Extremity Assessment Lower Extremity Assessment: Defer to PT evaluation       Communication Communication Communication: HOH   Cognition Arousal/Alertness: Awake/alert Behavior During Therapy: WFL for tasks assessed/performed Overall Cognitive Status: Within Functional Limits for tasks assessed                     General Comments       Exercises       Shoulder Instructions      Home Living Family/patient expects to be discharged to:: Private residence Living Arrangements: Children Available Help at Discharge: Family Type of Home: Other(Comment) (condo on one level)       Home Layout: One level     Bathroom Shower/Tub: Tub/shower unit Shower/tub characteristics: Engineer, building servicesCurtain Bathroom Toilet: Standard Bathroom Accessibility: Yes How Accessible: Accessible via walker            Prior Functioning/Environment Level of Independence: Independent             OT Diagnosis: Generalized weakness;Acute pain  OT Problem List: Decreased strength;Decreased range of motion;Decreased activity tolerance;Decreased safety awareness   OT Treatment/Interventions: Self-care/ADL training;DME and/or AE instruction;Therapeutic activities    OT Goals(Current goals can be found in the care plan section) Acute Rehab OT Goals Patient Stated Goal: to go [facility] OT Goal Formulation: With patient Time For Goal Achievement: 04/21/15 Potential to Achieve Goals: Good  OT Frequency: Min 1X/week   Barriers to D/C: Other (comment) (lives at home alone; husband deceased)          Co-evaluation              End of Session Nurse Communication:  (needs more pain meds)  Activity Tolerance: Patient limited by pain Patient left: in chair;with call bell/phone within reach;with chair  alarm set   Time: 1015-1043 OT Time Calculation (min): 28 min Charges:  OT General Charges $OT Visit: 1 Procedure OT Evaluation $Initial OT Evaluation Tier I: 1 Procedure OT Treatments $Self Care/Home Management : 8-22 mins G-Codes:    Wofford,Susan 04/07/2015, 11:36 AM    Susanne BordersSusan Wofford, OTR/L

## 2015-04-07 NOTE — Progress Notes (Signed)
  Subjective: 1 Day Post-Op Procedure(s) (LRB): TOTAL KNEE ARTHROPLASTY (Right) Patient reports pain as moderate.   Patient seen in rounds with Dr. Rosita KeaMenz. Patient is well, but has had some minor complaints of Pain. Plan is to go Rehab after hospital stay. Negative for chest pain and shortness of breath Fever: no Gastrointestinal:negative for nausea and vomiting  Objective: Vital signs in last 24 hours: Temp:  [96.4 F (35.8 C)-98.6 F (37 C)] 97.8 F (36.6 C) (05/13 0439) Pulse Rate:  [57-82] 82 (05/13 0439) Resp:  [15-21] 18 (05/13 0439) BP: (101-138)/(36-89) 104/36 mmHg (05/13 0439) SpO2:  [93 %-100 %] 94 % (05/13 0439)  Intake/Output from previous day:  Intake/Output Summary (Last 24 hours) at 04/07/15 [id number] Last data filed at 04/07/15 0447  Gross per 24 hour  Intake   1540 ml  Output    975 ml  Net    565 ml    Intake/Output this shift: Total I/O In: -  Out: 200 [Urine:200]  Labs:  Recent Labs  04/06/15 1221  HGB 10.9*    Recent Labs  04/06/15 1221  WBC 8.2  RBC 3.50*  HCT 33.5*  PLT 201    Recent Labs  04/06/15 1221  CREATININE 0.66   No results for input(s): LABPT, INR in the last 72 hours.   EXAM General - Patient is Alert and Oriented Extremity - Neurovascular intact Dorsiflexion/Plantar flexion intact Dressing/Incision - clean, dry Motor Function - intact, moving foot and toes well on exam. Unable to do SLR  Past Medical History  Diagnosis Date  . Hypertension   . Arthritis     Assessment/Plan: 1 Day Post-Op Procedure(s) (LRB): TOTAL KNEE ARTHROPLASTY (Right) Active Problems:   Primary osteoarthritis of knee  Estimated body mass index is 27.45 kg/(m^2) as calculated from the following:   Height as of this encounter: 5\' 6"  (1.676 m).   Weight as of this encounter: 77.111 kg (170 lb). Advance diet Up with therapy  DVT Prophylaxis - Lovenox, Foot Pumps and TED hose Weight-Bearing as tolerated to Right leg  Dedra Skeensodd Kymir Coles,  PA-C Orthopaedic Surgery 04/07/2015, 6:32 AM

## 2015-04-07 NOTE — Evaluation (Signed)
Physical Therapy Evaluation Patient Details Name: Whitney RidgesCornelia Nordlund MRN: [id number] DOB: 11/20/1933 Today's Date: 04/07/2015   History of Present Illness  R TKA  Clinical Impression  POD 1 TKA, pt struggles with PT and though she shows great effort strength, mobility and standing are very limited.  She needs max assist transfer from bed to recliner.    Follow Up Recommendations SNF    Equipment Recommendations  Rolling walker with 5" wheels (pt to have family see what kind of walker she has)    Recommendations for Other Services       Precautions / Restrictions Precautions Precautions: Fall Required Braces or Orthoses: Knee Immobilizer - Right Restrictions Weight Bearing Restrictions: Yes RLE Weight Bearing: Weight bearing as tolerated      Mobility  Bed Mobility Overal bed mobility: Needs Assistance Bed Mobility: Supine to Sit     Supine to sit: Max assist        Transfers Overall transfer level: Needs assistance   Transfers: Stand Pivot Transfers   Stand pivot transfers: Max assist          Ambulation/Gait Ambulation/Gait assistance:  (unable)              Stairs            Wheelchair Mobility    Modified Rankin (Stroke Patients Only)       Balance Overall balance assessment: Needs assistance Sitting-balance support: No upper extremity supported;Bilateral upper extremity supported                                         Pertinent Vitals/Pain Pain Assessment: 0-10 Pain Score: 5  Pain Location: R knee Pain Descriptors / Indicators: Aching;Constant Pain Intervention(s): Limited activity within patient's tolerance;Premedicated before session;Patient requesting pain meds-RN notified    Home Living Family/patient expects to be discharged to:: Skilled nursing facility Living Arrangements: Alone (pt reports that she lives alone) Available Help at Discharge: Family Type of Home: Other(Comment) (condo on one level)        Home Layout: One level   Additional Comments:  (3 steps with R rail)    Prior Function Level of Independence:  (thinks she )               Hand Dominance   Dominant Hand: Right    Extremity/Trunk Assessment   Upper Extremity Assessment: Overall WFL for tasks assessed           Lower Extremity Assessment: Generalized weakness (R LE very limited regarding AROM secondary to pain)         Communication   Communication: No difficulties  Cognition Arousal/Alertness: Awake/alert Behavior During Therapy: WFL for tasks assessed/performed Overall Cognitive Status: Within Functional Limits for tasks assessed                      General Comments      Exercises Total Joint Exercises Quad Sets: 10 reps Short Arc Quad: 5 reps Heel Slides: 5 reps (with flexion overpressure) Goniometric ROM:  (3-56)      Assessment/Plan    PT Assessment Patient needs continued PT services  PT Diagnosis     PT Problem List Decreased strength;Decreased activity tolerance;Decreased range of motion;Decreased balance;Decreased mobility  PT Treatment Interventions Gait training;Functional mobility training;Stair training;Therapeutic activities;Therapeutic exercise;Balance training   PT Goals (Current goals can be found in the Care Plan section) Acute Rehab PT  Goals Patient Stated Goal:  (get back to walking) PT Goal Formulation: With patient Time For Goal Achievement: 04/21/15 Potential to Achieve Goals: Good    Frequency BID   Barriers to discharge        Co-evaluation               End of Session Equipment Utilized During Treatment: Gait belt   Patient left: in chair Nurse Communication: Mobility status (positioning)         Time: 1006-1040 PT Time Calculation (min) (ACUTE ONLY): 34 min   Charges:   PT Evaluation $Initial PT Evaluation Tier I: 1 Procedure PT Treatments $Therapeutic Exercise: 8-22 mins   PT G Codes:       Loran SentersGalen Robson Trickey, PT, DPT  [phone]  Malachi ProGalen R Eriel Dunckel 04/07/2015, 1:59 PM

## 2015-04-07 NOTE — Care Management Note (Addendum)
Case Management Note  Patient Details  Name: Whitney RidgesCornelia Mcneil MRN: [id number] Date of Birth: 11/09/1933  Subjective/Objective:                  Patient resting in bed. [facility]; PT pending. She has a supportive family but lives alone. She states she has a rolling walker in the home. [facility]. She uses [facility] [address].    Action/Plan: RNCM will continue to follow.   Expected Discharge Date:                  Expected Discharge Plan:     In-House Referral:  Clinical Social Work  Discharge planning Services  CM Consult  Post Acute Care Choice:    Choice offered to:  Patient  DME Arranged:    DME Agency:     HH Arranged:  PT [facility]  Status of Service:     Medicare Important Message Given:  Yes Date Medicare IM Given:  04/07/15 Medicare IM give by:  Collie SiadAngela Davarious Tumbleson Date Additional Medicare IM Given:    Additional Medicare Important Message give by:     If discussed at Long Length of Stay Meetings, dates discussed:    Additional Comments:  Collie Siadngela Kierria Feigenbaum, RN 04/07/2015, 10:11 AM

## 2015-04-07 NOTE — Progress Notes (Signed)
Physical Therapy Treatment Patient Details Name: Whitney Mcneil MRN: [id number] DOB: 10/30/1933 Today's Date: 04/07/2015    History of Present Illness R TKA    PT Comments    Pt shows good effort and is apologetic about not being able to do more, but ultimately is able to do very little.  She needs +2 max assist just to stand and really does not really show any ability to take steps or keep herself up w/o excessive assist.    Follow Up Recommendations  SNF     Equipment Recommendations  Rolling walker with 5" wheels (pt to have family see what kind of walker she has)    Recommendations for Other Services       Precautions / Restrictions Precautions Precautions: Fall Required Braces or Orthoses: Knee Immobilizer - Right Restrictions Weight Bearing Restrictions: Yes RLE Weight Bearing: Weight bearing as tolerated    Mobility  Bed Mobility Overal bed mobility: Needs Assistance Bed Mobility: Sit to Supine     Supine to sit: Max assist        Transfers Overall transfer level: Needs assistance   Transfers: Sit to/from Stand Sit to Stand: Max assist;+2 physical assistance Stand pivot transfers: Max assist          Ambulation/Gait Ambulation/Gait assistance: Max assist;+2 physical assistance Ambulation Distance (Feet):  (bed to recliner) Assistive device: 4-wheeled walker       General Gait Details: pt very anxious, unstady, complaining of pain and generally does poorly with "ambulation" needing max assist just to remain upright   Stairs            Wheelchair Mobility    Modified Rankin (Stroke Patients Only)       Balance Overall balance assessment: Needs assistance Sitting-balance support: No upper extremity supported;Bilateral upper extremity supported                                Cognition Arousal/Alertness: Awake/alert Behavior During Therapy: WFL for tasks assessed/performed Overall Cognitive Status: Within Functional  Limits for tasks assessed                      Exercises Total Joint Exercises Quad Sets: 10 reps Short Arc Quad: 5 reps Heel Slides: 5 reps (with flexion overpressure) Goniometric ROM:  (3-56)    General Comments        Pertinent Vitals/Pain Pain Assessment: 0-10 Pain Score: 9     Home Living Family/patient expects to be discharged to:: Skilled nursing facility Living Arrangements: Alone (pt reports that she lives alone)             Additional Comments:  (3 steps with R rail)    Prior Function Level of Independence:  (thinks she )          PT Goals (current goals can now be found in the care plan section) Acute Rehab PT Goals Patient Stated Goal:  (get back to walking) PT Goal Formulation: With patient Time For Goal Achievement: 04/21/15 Potential to Achieve Goals: Good    Frequency  BID    PT Plan Current plan remains appropriate    Co-evaluation             End of Session Equipment Utilized During Treatment: Gait belt   Patient left: with bed alarm set     Time: 2841-32441409-1433 PT Time Calculation (min) (ACUTE ONLY): 24 min  Charges:  $Gait Training: 8-22 mins $Therapeutic  Exercise: 8-22 mins                    G Codes:     Loran SentersGalen Whitney Mcneil, [place]PT, DPT [phone]  Whitney ProGalen R Jesus Mcneil 04/07/2015, 3:52 PM

## 2015-04-07 NOTE — Clinical Social Work Note (Signed)
Clinical Social Work Assessment  Patient Details  Name: Whitney Mcneil MRN: [id number] Date of Birth: 08/27/1933  Date of referral:  04/07/15               Reason for consult:  Facility Placement                Permission sought to share information with:  Facility Contact Representative Permission granted to share information::  Yes, Verbal Permission Granted  Name::      [facility]  Agency::   Skilled Nursing Facility  Relationship::     Contact Information:     Housing/Transportation Living arrangements for the past 2 months:  Single Family Home Source of Information:  Patient Patient Interpreter Needed:  None Criminal Activity/Legal Involvement Pertinent to Current Situation/Hospitalization:  No - Comment as needed Significant Relationships:  Siblings Lives with:  Self Do you feel safe going back to the place where you live?  Yes Need for family participation in patient care:  Yes (Comment)   Social Worker assessment / plan: Clinical Social Worker (CSW) met with patient to discuss D/C plan. CSW introduced self and explained role of CSW department. Patient reported that she lives alone and her sister Whitney Mcneil is her primary support. CSW explained that PT is recommending SNF. Patient is agreeable to SNF search and prefers [facility]. Andrea admissions coordinator at [facility] reported that they can accept patient on Monday. CSW made patient aware of above. Plan is for patient to D/C to [facility] Monday. MD is aware of above.   Employment status:  Retired Insurance information:  Medicare PT Recommendations:  Skilled Nursing Facility Information / Referral to community resources:  Skilled Nursing Facility  Patient/Family's Response to care:  Patient is agreeable with going to rehab at [facility] on Monday.   Patient/Family's Understanding of and Emotional Response to Diagnosis, Current Treatment, and Prognosis: Patient asked questions about SNF. CSW answered all questions and  provided emotional support. CSW will continue to follow and assist as needed.   Emotional Assessment Appearance:  Appears stated age Attitude/Demeanor/Rapport:    Affect (typically observed):  Accepting, Calm Orientation:  Oriented to Self, Oriented to Place, Oriented to  Time, Oriented to Situation Alcohol / Substance use:  Not Applicable Psych involvement (Current and /or in the community):  No (Comment)  Discharge Needs  Concerns to be addressed:  Discharge Planning Concerns Readmission within the last 30 days:  No Current discharge risk:  None Barriers to Discharge:  No Barriers Identified   Morgan, Bailey G, LCSW 04/07/2015, 5:52 PM  

## 2015-04-07 NOTE — Clinical Social Work Placement (Signed)
   CLINICAL SOCIAL WORK PLACEMENT  NOTE  Date:  04/07/2015  Patient Details  Name: Whitney Mcneil MRN: [id number] Date of Birth: 11/04/1933  Clinical Social Work is seeking post-discharge placement for this patient at the Skilled  Nursing Facility level of care (*CSW will initial, date and re-position this form in  chart as items are completed):  Yes   Patient/family provided with [place] Clinical Social Work Department's list of facilities offering this level of care within the geographic area requested by the patient (or if unable, by the patient's family).  Yes   Patient/family informed of their freedom to choose among providers that offer the needed level of care, that participate in Medicare, Medicaid or managed care program needed by the patient, have an available bed and are willing to accept the patient.  Yes   Patient/family informed of [facility]'s ownership interest in [facility] and [facility], as well as of the fact that they are under no obligation to receive care at these facilities.  PASRR submitted to EDS on 04/07/15     PASRR number received on 04/07/15     Existing PASRR number confirmed on       FL2 transmitted to all facilities in geographic area requested by pt/family on 04/07/15     FL2 transmitted to all facilities within larger geographic area on       Patient informed that his/her managed care company has contracts with or will negotiate with certain facilities, including the following:            Patient/family informed of bed offers received.  Patient chooses bed at       Physician recommends and patient chooses bed at      Patient to be transferred to   on  .  Patient to be transferred to facility by       Patient family notified on   of transfer.  Name of family member notified:        PHYSICIAN Please sign FL2     Additional Comment:    _______________________________________________ Haig ProphetMorgan, Para Cossey G, LCSW 04/07/2015,  5:50 PM

## 2015-04-08 LAB — BASIC METABOLIC PANEL
Anion gap: 3 — ABNORMAL LOW (ref 5–15)
BUN: 13 mg/dL (ref 6–20)
CO2: 25 mmol/L (ref 22–32)
CREATININE: 0.68 mg/dL (ref 0.44–1.00)
Calcium: 8 mg/dL — ABNORMAL LOW (ref 8.9–10.3)
Chloride: 108 mmol/L (ref 101–111)
GFR calc non Af Amer: >60 mL/min (ref >60)
Glucose, Bld: 130 mg/dL — ABNORMAL HIGH (ref 65–99)
Potassium: 3.6 mmol/L (ref 3.5–5.1)
SODIUM: 136 mmol/L (ref 135–145)

## 2015-04-08 LAB — HEMOGLOBIN: Hemoglobin: 8.5 g/dL — ABNORMAL LOW (ref 12.0–16.0)

## 2015-04-08 MED ORDER — TRAMADOL HCL 50 MG PO TABS
50.0000 mg | ORAL_TABLET | Freq: Four times a day (QID) | ORAL | Status: DC
  Administered 2015-04-08 – 2015-04-10 (×9): 50 mg via ORAL
  Filled 2015-04-08 (×9): qty 1

## 2015-04-08 MED ORDER — ENOXAPARIN SODIUM 40 MG/0.4ML ~~LOC~~ SOLN
40.0000 mg | SUBCUTANEOUS | Status: DC
  Administered 2015-04-09 – 2015-04-10 (×2): 40 mg via SUBCUTANEOUS
  Filled 2015-04-08 (×2): qty 0.4

## 2015-04-08 NOTE — Progress Notes (Signed)
Physical Therapy Treatment Patient Details Name: Whitney RidgesCornelia Mcneil MRN: [id number] DOB: 10/14/1933 Today's Date: 04/08/2015    History of Present Illness R TKA    PT Comments    Pt does better with exercises and mobility today, but remains very limited with standing/ambulation ability.  She continues to need heavy +2 assist and though she shows good effort she is very limited functionally.    Follow Up Recommendations  SNF     Equipment Recommendations  Rolling walker with 5" wheels (pt to have family see what kind of walker she has)    Recommendations for Other Services       Precautions / Restrictions Precautions Precautions: Fall Required Braces or Orthoses: Knee Immobilizer - Right Restrictions Weight Bearing Restrictions: Yes RLE Weight Bearing: Weight bearing as tolerated    Mobility  Bed Mobility Overal bed mobility: Needs Assistance Bed Mobility: Supine to Sit     Supine to sit: Max assist;Mod assist        Transfers Overall transfer level: Needs assistance   Transfers: Sit to/from Stand Sit to Stand: Max assist;+2 physical assistance            Ambulation/Gait     Assistive device: Rolling walker (2 wheeled)       General Gait Details: Pt with less anxiousness, better able to keep hips forward and shoudlers up but is still very unsteady, requires excessive help and is not at all safe or stable but is able to purposefully take a few "steps" today   Stairs            Wheelchair Mobility    Modified Rankin (Stroke Patients Only)       Balance                                    Cognition Arousal/Alertness: Awake/alert Behavior During Therapy: WFL for tasks assessed/performed Overall Cognitive Status: Within Functional Limits for tasks assessed                      Exercises Total Joint Exercises Quad Sets: 10 reps;Strengthening Short Arc Quad: 10 reps;AAROM Heel Slides: 10 reps (with flexion  overpressure) Hip ABduction/ADduction: AAROM;10 reps Goniometric ROM: 3-64    General Comments        Pertinent Vitals/Pain Pain Assessment: 0-10 Pain Score: 7     Home Living                      Prior Function            PT Goals (current goals can now be found in the care plan section) Acute Rehab PT Goals PT Goal Formulation: With patient Time For Goal Achievement: 04/21/15 Potential to Achieve Goals: Good    Frequency  BID    PT Plan Current plan remains appropriate    Co-evaluation             End of Session Equipment Utilized During Treatment: Gait belt   Patient left: in chair     Time: [id number] PT Time Calculation (min) (ACUTE ONLY): 24 min  Charges:  $Therapeutic Exercise: 8-22 mins $Therapeutic Activity: 8-22 mins                    G Codes:     Loran SentersGalen Ethne Jeon, PT, DPT [phone]  Malachi ProGalen R Radonna Bracher 04/08/2015, 10:01 AM

## 2015-04-08 NOTE — Progress Notes (Signed)
  Subjective: 2 Days Post-Op Procedure(s) (LRB): TOTAL KNEE ARTHROPLASTY (Right) Patient reports pain as mild.   Patient seen in rounds with Dr. Rosita KeaMenz. Patient is well, and has had no acute complaints or problems Plan is to go Rehab after hospital stay. Negative for chest pain and shortness of breath Fever: no Gastrointestinal:negative for nausea and vomiting  Objective: Vital signs in last 24 hours: Temp:  [98.2 F (36.8 C)-99.2 F (37.3 C)] 99.2 F (37.3 C) (05/14 0515) Pulse Rate:  [75-88] 83 (05/14 0515) Resp:  [18-20] 18 (05/14 0515) BP: (101-130)/(50-69) 115/51 mmHg (05/14 0515) SpO2:  [94 %-100 %] 94 % (05/14 0515)  Intake/Output from previous day:  Intake/Output Summary (Last 24 hours) at 04/08/15 0718 Last data filed at 04/08/15 [id number]  Gross per 24 hour  Intake    720 ml  Output   1250 ml  Net   -530 ml    Intake/Output this shift:    Labs:  Recent Labs  04/06/15 1221 04/08/15 0455  HGB 10.9* 8.5*    Recent Labs  04/06/15 1221  WBC 8.2  RBC 3.50*  HCT 33.5*  PLT 201    Recent Labs  04/06/15 1221 04/08/15 0455  NA  --  136  K  --  3.6  CL  --  108  CO2  --  25  BUN  --  13  CREATININE 0.66 0.68  GLUCOSE  --  130*  CALCIUM  --  8.0*   No results for input(s): LABPT, INR in the last 72 hours.   EXAM General - Patient is Alert and Oriented Extremity - Neurovascular intact Intact pulses distally Dorsiflexion/Plantar flexion intact Incision: dressing C/D/I and new dressing applied Dressing/Incision - clean, dry, no drainage, healing Motor Function - intact, moving foot and toes well on exam.   Past Medical History  Diagnosis Date  . Hypertension   . Arthritis     Assessment/Plan: 2 Days Post-Op Procedure(s) (LRB): TOTAL KNEE ARTHROPLASTY (Right) Active Problems:   Primary osteoarthritis of knee  Estimated body mass index is 27.45 kg/(m^2) as calculated from the following:   Height as of this encounter: 5\' 6"  (1.676 m).  Weight as of this encounter: 77.111 kg (170 lb). Advance diet Up with therapy D/C IV fluids Discharge to SNF on Monday  DVT Prophylaxis - Lovenox, Foot Pumps and TED hose Weight-Bearing as tolerated to Right leg  Dedra Skeensodd Greer Wainright, PA-C Orthopaedic Surgery 04/08/2015, 7:18 AM

## 2015-04-08 NOTE — Progress Notes (Signed)
Pt complained of pain earlier in the shift that was not relieved by po medication, but was relieved with morphine 4mg . Turned and repositioned for comfort as needed. POD 3.

## 2015-04-08 NOTE — Progress Notes (Signed)
Physical Therapy Treatment Patient Details Name: Whitney RidgesCornelia Morissette MRN: [id number] DOB: 03/24/1933 Today's Date: 04/08/2015    History of Present Illness R TKA    PT Comments    Pt continues to show good effort and willingness to participate but is still very limited.  She does much better with standing and though she is still only able to go from recliner to bed with great effort and much assist she is much safer and less anxious the entire time.  Pt continues to be very pain limited with flexion and needs much cuing and encouragement with this.   Follow Up Recommendations  SNF     Equipment Recommendations  Rolling walker with 5" wheels (pt to have family see what kind of walker she has)    Recommendations for Other Services       Precautions / Restrictions Precautions Precautions: Fall Required Braces or Orthoses: Knee Immobilizer - Right Restrictions Weight Bearing Restrictions: Yes RLE Weight Bearing: Weight bearing as tolerated    Mobility  Bed Mobility Overal bed mobility: Needs Assistance Bed Mobility: Supine to Sit       Sit to supine: Mod assist      Transfers Overall transfer level: Needs assistance     Sit to Stand: Max assist            Ambulation/Gait Ambulation/Gait assistance: Max assist   Assistive device: Rolling walker (2 wheeled)  Pt able to take a few unsure, guarded steps from recliner to bed.     General Gait Details: Pt shows much calmer and more deliberate effort this afternoon.  She is still very unsafe and needing considerable assist, but shows much improved ability to stay upright and use walker appropriately   Stairs            Wheelchair Mobility    Modified Rankin (Stroke Patients Only)       Balance                                    Cognition Arousal/Alertness: Awake/alert Behavior During Therapy: WFL for tasks assessed/performed Overall Cognitive Status: Within Functional Limits for tasks  assessed                      Exercises Total Joint Exercises Quad Sets: 10 reps;Strengthening Short Arc Quad: 10 reps;AAROM Heel Slides: 10 reps (with flexion overpressure) Hip ABduction/ADduction: AAROM;10 reps Goniometric ROM: mid 60s for flexion    General Comments        Pertinent Vitals/Pain Pain Score: 6     Home Living                      Prior Function            PT Goals (current goals can now be found in the care plan section) Acute Rehab PT Goals PT Goal Formulation: With patient Time For Goal Achievement: 04/21/15 Potential to Achieve Goals: Good    Frequency  BID    PT Plan Current plan remains appropriate    Co-evaluation             End of Session Equipment Utilized During Treatment: Gait belt   Patient left: with bed alarm set     Time: [id number] PT Time Calculation (min) (ACUTE ONLY): 27 min  Charges:  $Gait Training: 8-22 mins $Therapeutic Exercise: 8-22 mins  G Codes:     Loran SentersGalen Sevyn Paredez, PT, DPT [phone]  Malachi ProGalen R Emylia Latella 04/08/2015, 3:28 PM

## 2015-04-09 ENCOUNTER — Encounter: Payer: Self-pay | Admitting: Orthopedic Surgery

## 2015-04-09 NOTE — Progress Notes (Signed)
Pt did not request pain medication outside scheduled Tramadol. POD 3. Pt is heavy 2 to 3 person assist out of bed but reported she will be [facility].

## 2015-04-09 NOTE — Discharge Planning (Signed)
INSTRUCTIONS AFTER Surgery  o Remove items at home which could result in a fall. This includes throw rugs or furniture in walking pathways o ICE to the affected joint every three hours while awake for 30 minutes at a time, for at least the first 3-5 days, and then as needed for pain and swelling.  Continue to use ice for pain and swelling. You may notice swelling that will progress down to the foot and ankle.  This is normal after surgery.  Elevate your leg when you are not up walking on it.   o Continue to use the breathing machine you got in the hospital (incentive spirometer) which will help keep your temperature down.  It is common for your temperature to cycle up and down following surgery, especially at night when you are not up moving around and exerting yourself.  The breathing machine keeps your lungs expanded and your temperature down.   DIET:  As you were doing prior to hospitalization, we recommend a well-balanced diet.  DRESSING / WOUND CARE / SHOWERING  Keep the surgical dressing until follow up.  The dressing is water proof, so you can shower without any extra covering.  IF THE DRESSING FALLS OFF or the wound gets wet inside, change the dressing with sterile gauze.  Please use good hand washing techniques before changing the dressing.  Do not use any lotions or creams on the incision until instructed by your surgeon.   Nursing can change dressing if needed to new Cov[place].    ACTIVITY  o Increase activity slowly as tolerated, but follow the weight bearing instructions below.   o No driving for 6 weeks or until further direction given by your physician.  You cannot drive while taking narcotics.  o No lifting or carrying greater than 10 lbs. until further directed by your surgeon. o Avoid periods of inactivity such as sitting longer than an hour when not asleep. This helps prevent blood clots.  o You may return to work once you are authorized by your doctor.     WEIGHT BEARING    Weight bearing as tolerated with assist device (walker, cane, etc) as directed, use it as long as suggested by your surgeon or therapist, typically at least 4-6 weeks.   EXERCISES  Results after joint surgery are often greatly improved when you follow the exercise, range of motion and muscle strengthening exercises prescribed by your doctor. Safety measures are also important to protect the joint from further injury. Any time any of these exercises cause you to have increased pain or swelling, decrease what you are doing until you are comfortable again and then slowly increase them. If you have problems or questions, call your caregiver or physical therapist for advice.   Rehabilitation is important following a joint surgery. After just a few days of immobilization, the muscles of the leg can become weakened and shrink (atrophy).  These exercises are designed to build up the tone and strength of the thigh and leg muscles and to improve motion. Often times heat used for twenty to thirty minutes before working out will loosen up your tissues and help with improving the range of motion but do not use heat for the first two weeks following surgery (sometimes heat can increase post-operative swelling).   These exercises can be done on a training (exercise) mat, on the floor, on a table or on a bed. Use whatever works the best and is most comfortable for you.    Use music  or television while you are exercising so that the exercises are a pleasant break in your day. This will make your life better with the exercises acting as a break in your routine that you can look forward to.   Perform all exercises about fifteen times, three times per day or as directed.  You should exercise both the operative leg and the other leg as well.  Exercises include:   . Quad Sets - Tighten up the muscle on the front of the thigh (Quad) and hold for 5-10 seconds.   . Straight Leg Raises - With your knee straight (if you were  given a brace, keep it on), lift the leg to 60 degrees, hold for 3 seconds, and slowly lower the leg.  Perform this exercise against resistance later as your leg gets stronger.  . Leg Slides: Lying on your back, slowly slide your foot toward your buttocks, bending your knee up off the floor (only go as far as is comfortable). Then slowly slide your foot back down until your leg is flat on the floor again.  Lawanna Kobus Wings: Lying on your back spread your legs to the side as far apart as you can without causing discomfort.  . Hamstring Strength:  Lying on your back, push your heel against the floor with your leg straight by tightening up the muscles of your buttocks.  Repeat, but this time bend your knee to a comfortable angle, and push your heel against the floor.  You may put a pillow under the heel to make it more comfortable if necessary.   A rehabilitation program following joint surgery can speed recovery and prevent re-injury in the future due to weakened muscles. Contact your doctor or a physical therapist for more information on knee rehabilitation.    CONSTIPATION  Constipation is defined medically as fewer than three stools per week and severe constipation as less than one stool per week.  Even if you have a regular bowel pattern at home, your normal regimen is likely to be disrupted due to multiple reasons following surgery.  Combination of anesthesia, postoperative narcotics, change in appetite and fluid intake all can affect your bowels.   YOU MUST use at least one of the following options; they are listed in order of increasing strength to get the job done.  They are all available over the counter, and you may need to use some, POSSIBLY even all of these options:    Drink plenty of fluids (prune juice may be helpful) and high fiber foods Colace 100 mg by mouth twice a day  Senokot for constipation as directed and as needed Dulcolax (bisacodyl), take with full glass of water  Miralax  (polyethylene glycol) once or twice a day as needed.  If you have tried all these things and are unable to have a bowel movement in the first 3-4 days after surgery call either your surgeon or your primary doctor.    If you experience loose stools or diarrhea, hold the medications until you stool forms back up.  If your symptoms do not get better within 1 week or if they get worse, check with your doctor.  If you experience "the worst abdominal pain ever" or develop nausea or vomiting, please contact the office immediately for further recommendations for treatment.   ITCHING:  If you experience itching with your medications, try taking only a single pain pill, or even half a pain pill at a time.  You can also use Benadryl over  the counter for itching or also to help with sleep.   TED HOSE STOCKINGS:  Use stockings on both legs until for at least 2 weeks or as directed by physician office. They may be removed at night for sleeping.  MEDICATIONS:  See your medication summary on the "After Visit Summary" that nursing will review with you.  You may have some home medications which will be placed on hold until you complete the course of blood thinner medication.  It is important for you to complete the blood thinner medication as prescribed.  PRECAUTIONS:  If you experience chest pain or shortness of breath - call 911 immediately for transfer to the hospital emergency department.   If you develop a fever greater that 101 F, purulent drainage from wound, increased redness or drainage from wound, foul odor from the wound/dressing, or calf pain - CONTACT YOUR SURGEON.                                                   FOLLOW-UP APPOINTMENTS:  If you do not already have a post-op appointment, please call the office for an appointment to be seen by your surgeon.  Guidelines for how soon to be seen are listed in your "After Visit Summary", but are typically between 1-4 weeks after surgery.  OTHER  INSTRUCTIONS:     MAKE SURE YOU:  . Understand these instructions.  . Get help right away if you are not doing well or get worse.    Thank you for letting us be a part of your medical care team.  It is a privilege we respect greatly.  We hope these instructions will help you stay on track for a fast and full recovery!

## 2015-04-09 NOTE — Progress Notes (Signed)
Patient is alert and oriented, with some intermittent confusion to situation in afternoon. Reporting tolerable pain control. Up to chair with PT for most of shift. Resting quietly between care. Will continue to monitor.

## 2015-04-09 NOTE — Progress Notes (Signed)
Physical Therapy Treatment Patient Details Name: Whitney RidgesCornelia Mcneil MRN: [id number] DOB: 06/04/1933 Today's Date: 04/09/2015    History of Present Illness R TKA    PT Comments    Pt continues to be slow with post-op progression, but ultimately does well and is showing some increase in function.  She continues to display good effort, but is ultimately still needs max assist with all acts and is unable to do SAQ against gravity, etc.   Follow Up Recommendations  SNF     Equipment Recommendations  Rolling walker with 5" wheels (pt to have family see what kind of walker she has)    Recommendations for Other Services       Precautions / Restrictions Precautions Precautions: Fall Required Braces or Orthoses: Knee Immobilizer - Right Restrictions Weight Bearing Restrictions: Yes RLE Weight Bearing: Weight bearing as tolerated    Mobility  Bed Mobility Overal bed mobility: Needs Assistance Bed Mobility: Supine to Sit     Supine to sit: Mod assist Sit to supine: Mod assist      Transfers Overall transfer level: Needs assistance Equipment used: Rolling walker (2 wheeled) Transfers: Sit to/from Stand Sit to Stand: Max assist            Ambulation/Gait Ambulation/Gait assistance: Max assist Ambulation Distance (Feet):  (bed to recliner) Assistive device: Rolling walker (2 wheeled)       General Gait Details: Pt again much calmer and more deliberate with her effort this session.  She is still very unsafe and needing considerable assist, but shows much improved ability to stay upright and use walker appropriately   Stairs            Wheelchair Mobility    Modified Rankin (Stroke Patients Only)       Balance                                    Cognition Arousal/Alertness: Awake/alert Behavior During Therapy: WFL for tasks assessed/performed Overall Cognitive Status: Within Functional Limits for tasks assessed                      Exercises Total Joint Exercises Quad Sets: 10 reps;Strengthening Short Arc Quad: 10 reps;AAROM Heel Slides: 10 reps (with flexion overpressure) Hip ABduction/ADduction: AAROM;10 reps Goniometric ROM: pt again unable to get >70 degrees of flexion    General Comments        Pertinent Vitals/Pain Pain Score: 3     Home Living                      Prior Function            PT Goals (current goals can now be found in the care plan section) Acute Rehab PT Goals PT Goal Formulation: With patient Time For Goal Achievement: 04/21/15 Potential to Achieve Goals: Good    Frequency  BID    PT Plan Current plan remains appropriate    Co-evaluation             End of Session Equipment Utilized During Treatment: Gait belt   Patient left: with bed alarm set     Time: [id number] PT Time Calculation (min) (ACUTE ONLY): 26 min  Charges:  $Gait Training: 8-22 mins $Therapeutic Exercise: 8-22 mins                    G Codes:  Loran SentersGalen Karsin Pesta, PT, DPT [phone]  Malachi ProGalen R Petra Sargeant 04/09/2015, 10:24 AM

## 2015-04-09 NOTE — Progress Notes (Signed)
  Subjective: 3 Days Post-Op Procedure(s) (LRB): TOTAL KNEE ARTHROPLASTY (Right) Patient reports pain as mild.   Patient seen in rounds with Dr. Rosita KeaMenz. Patient is well, and has had no acute complaints or problems Plan is to go Rehab after hospital stay. Negative for chest pain and shortness of breath Fever: no Gastrointestinal:negative for nausea and vomiting  Objective: Vital signs in last 24 hours: Temp:  [98.4 F (36.9 C)-98.8 F (37.1 C)] 98.4 F (36.9 C) (05/15 0449) Pulse Rate:  [83-93] 83 (05/15 0449) Resp:  [16-18] 16 (05/15 0449) BP: (131-156)/(48-62) 131/50 mmHg (05/15 0449) SpO2:  [96 %-100 %] 96 % (05/15 0449)  Intake/Output from previous day:  Intake/Output Summary (Last 24 hours) at 04/09/15 0659 Last data filed at 04/08/15 2051  Gross per 24 hour  Intake    600 ml  Output    600 ml  Net      0 ml    Intake/Output this shift: Total I/O In: -  Out: 300 [Urine:300]  Labs:  Recent Labs  04/06/15 1221 04/08/15 0455  HGB 10.9* 8.5*    Recent Labs  04/06/15 1221  WBC 8.2  RBC 3.50*  HCT 33.5*  PLT 201    Recent Labs  04/06/15 1221 04/08/15 0455  NA  --  136  K  --  3.6  CL  --  108  CO2  --  25  BUN  --  13  CREATININE 0.66 0.68  GLUCOSE  --  130*  CALCIUM  --  8.0*   No results for input(s): LABPT, INR in the last 72 hours.   EXAM General - Patient is Alert and Oriented Extremity - Neurovascular intact Intact pulses distally Dorsiflexion/Plantar flexion intact Incision: scant drainage Dressing/Incision - clean, dry, blood tinged drainage Motor Function - intact, moving foot and toes well on exam.   Past Medical History  Diagnosis Date  . Hypertension   . Arthritis     Assessment/Plan: 3 Days Post-Op Procedure(s) (LRB): TOTAL KNEE ARTHROPLASTY (Right) Active Problems:   Primary osteoarthritis of knee  Estimated body mass index is 27.45 kg/(m^2) as calculated from the following:   Height as of this encounter: 5\' 6"   (1.676 m).   Weight as of this encounter: 77.111 kg (170 lb). Up with therapy Plan for discharge tomorrow to SNF/Rehab  DVT Prophylaxis - Lovenox, Foot Pumps and TED hose Weight-Bearing as tolerated to Right leg  Whitney Skeensodd Heron Pitcock, PA-C Orthopaedic Surgery 04/09/2015, 6:59 AM

## 2015-04-10 LAB — SURGICAL PATHOLOGY

## 2015-04-10 MED ORDER — TRAMADOL HCL 50 MG PO TABS: 50.0000 mg | ORAL_TABLET | Freq: Four times a day (QID) | ORAL | Status: DC | PRN

## 2015-04-10 MED ORDER — MAGNESIUM HYDROXIDE 400 MG/5ML PO SUSP
30.0000 mL | Freq: Every day | ORAL | Status: DC | PRN
  Administered 2015-04-10: 30 mL via ORAL
  Filled 2015-04-10: qty 30

## 2015-04-10 MED ORDER — BISACODYL 10 MG RE SUPP
10.0000 mg | Freq: Every day | RECTAL | Status: DC | PRN
  Administered 2015-04-10: 10 mg via RECTAL
  Filled 2015-04-10: qty 1

## 2015-04-10 MED ORDER — ACETAMINOPHEN 325 MG PO TABS: 650.0000 mg | ORAL_TABLET | Freq: Four times a day (QID) | ORAL | Status: DC | PRN

## 2015-04-10 MED ORDER — OXYCODONE HCL 5 MG PO TABS: 5.0000 mg | ORAL_TABLET | ORAL | Status: DC | PRN

## 2015-04-10 MED ORDER — ENOXAPARIN SODIUM 40 MG/0.4ML ~~LOC~~ SOLN: 40.0000 mg | SUBCUTANEOUS | Status: DC

## 2015-04-10 NOTE — Progress Notes (Signed)
  Subjective: 4 Days Post-Op Procedure(s) (LRB): TOTAL KNEE ARTHROPLASTY (Right) Patient reports pain as mild.   Patient seen in rounds with Dr. Rosita KeaMenz. Patient is well, and has had no acute complaints or problems.  No BM yet Plan is to go Skilled nursing facility after hospital stay. Negative for chest pain and shortness of breath Fever: no Gastrointestinal:negative for nausea and vomiting  Objective: Vital signs in last 24 hours: Temp:  [97.8 F (36.6 C)-98 F (36.7 C)] 97.9 F (36.6 C) (05/16 0446) Pulse Rate:  [68-81] 68 (05/16 0446) Resp:  [18] 18 (05/16 0446) BP: (105-143)/(42-110) 116/42 mmHg (05/16 0446) SpO2:  [96 %-100 %] 96 % (05/16 0446)  Intake/Output from previous day:  Intake/Output Summary (Last 24 hours) at 04/10/15 0637 Last data filed at 04/09/15 1800  Gross per 24 hour  Intake    600 ml  Output      0 ml  Net    600 ml    Intake/Output this shift:    Labs:  Recent Labs  04/08/15 0455  HGB 8.5*   No results for input(s): WBC, RBC, HCT, PLT in the last 72 hours.  Recent Labs  04/08/15 0455  NA 136  K 3.6  CL 108  CO2 25  BUN 13  CREATININE 0.68  GLUCOSE 130*  CALCIUM 8.0*   No results for input(s): LABPT, INR in the last 72 hours.   EXAM General - Patient is Alert and Oriented Extremity - Intact pulses distally Dorsiflexion/Plantar flexion intact Incision: dressing C/D/I and no drainage Dressing/Incision - clean, dry, no drainage Motor Function - intact, moving foot and toes well on exam.   Past Medical History  Diagnosis Date  . Hypertension   . Arthritis     Assessment/Plan: 4 Days Post-Op Procedure(s) (LRB): TOTAL KNEE ARTHROPLASTY (Right) Active Problems:   Primary osteoarthritis of knee  Estimated body mass index is 27.45 kg/(m^2) as calculated from the following:   Height as of this encounter: 5\' 6"  (1.676 m).   Weight as of this encounter: 77.111 kg (170 lb). Discharge to SNF  DVT Prophylaxis - Lovenox, Foot  Pumps and TED hose Weight-Bearing as tolerated to Right leg  Dedra Skeensodd Anastaisa Wooding, PA-C Orthopaedic Surgery 04/10/2015, 6:37 AM

## 2015-04-10 NOTE — Progress Notes (Signed)
Pt administered a laxative to have BM, since pt has not had one since before surgery. Not as active with movement as she should be POD 4. Plan for discharge to [facility].  Pain being controlled with Tramadol only, has not requested any additional pain medication.

## 2015-04-10 NOTE — Clinical Social Work Placement (Signed)
   CLINICAL SOCIAL WORK PLACEMENT  NOTE  Date:  04/10/2015  Patient Details  Name: Whitney Mcneil MRN: [id number] Date of Birth: 03/07/1933  Clinical Social Work is seeking post-discharge placement for this patient at the Skilled  Nursing Facility level of care (*CSW will initial, date and re-position this form in  chart as items are completed):  Yes   Patient/family provided with [facility] Clinical Social Work Department's list of facilities offering this level of care within the geographic area requested by the patient (or if unable, by the patient's family).  Yes   Patient/family informed of their freedom to choose among providers that offer the needed level of care, that participate in Medicare, Medicaid or managed care program needed by the patient, have an available bed and are willing to accept the patient.  Yes   Patient/family informed of [facility]'s ownership interest in [facility] and [facility], as well as of the fact that they are under no obligation to receive care at these facilities.  PASRR submitted to EDS on 04/07/15     PASRR number received on 04/07/15     Existing PASRR number confirmed on       FL2 transmitted to all facilities in geographic area requested by pt/family on 04/07/15     FL2 transmitted to all facilities within larger geographic area on       Patient informed that his/her managed care company has contracts with or will negotiate with certain facilities, including the following:        Yes   Patient/family informed of bed offers received.  Patient chooses bed at  [facility] )     Physician recommends and patient chooses bed at      Patient to be transferred to  [facility] ) on 04/10/15.  Patient to be transferred to facility by  [facility] )     Patient family notified on 04/10/15 of transfer.  Name of family member notified:   (Patient's sisters Windell MouldingRuth and Zollie ScaleOlivia )     PHYSICIAN Please sign FL2     Additional  Comment:    _______________________________________________ Haig ProphetMorgan, Melida Northington G, LCSW 04/10/2015, 11:12 AM

## 2015-04-10 NOTE — Progress Notes (Signed)
Physical Therapy Treatment Patient Details Name: Whitney Mcneil MRN: [id number] DOB: 10/14/1933 Today's Date: 04/10/2015    History of Present Illness R TKA    PT Comments    Pt demonstrates slow progress toward goals. She is limited by pain primarily. RLE remains considerably weak with difficulty demonstrating sufficient strength for abduction/adduction, SAQ, and SLR. No PT barriers to discharge. Pt is discharging today to SNF.    Follow Up Recommendations  SNF     Equipment Recommendations  Rolling walker with 5" wheels (pt to have family see what kind of walker she has)    Recommendations for Other Services       Precautions / Restrictions Precautions Precautions: Fall Required Braces or Orthoses: Knee Immobilizer - Right Restrictions Weight Bearing Restrictions: Yes RLE Weight Bearing: Weight bearing as tolerated    Mobility  Bed Mobility Overal bed mobility: Needs Assistance Bed Mobility: Supine to Sit     Supine to sit: Mod assist Sit to supine: Mod assist   General bed mobility comments: Decreased ability to scoot laterally in the bed  Transfers Overall transfer level: Needs assistance Equipment used: Rolling walker (2 wheeled) Transfers: Sit to/from Stand Sit to Stand: +2 physical assistance;Mod assist         General transfer comment: Pt initially very weak with sit to stand but once upright is able to remain standing with CGA only. Decreased anterior weight shifting and poor set-up secondary to RLE KI  Ambulation/Gait Ambulation/Gait assistance: +2 physical assistance;Mod assist Ambulation Distance (Feet): 4 Feet Assistive device: Rolling walker (2 wheeled) Gait Pattern/deviations: Decreased step length - left;Decreased step length - right (Increased time in double stance) Gait velocity: Below norms for household ambulation Gait velocity interpretation: <1.8 ft/sec, indicative of risk for recurrent falls General Gait Details: Pt demonstrates  difficulty accepting weight on RLE during gait. In addition she has difficulty advancing RLE forward. Step to pattern with heavy cues for proper sequencing, weight shifting, and safety. After very short distance pt complains of R knee pain and popping. Pt assisted back to bed. R knee inspected without signs of separation of incision or lost staples. KI donned for all ambulation.    Stairs            Wheelchair Mobility    Modified Rankin (Stroke Patients Only)       Balance   Sitting-balance support: Feet supported;No upper extremity supported                                Cognition Arousal/Alertness: Awake/alert Behavior During Therapy: WFL for tasks assessed/performed Overall Cognitive Status: Within Functional Limits for tasks assessed                      Exercises Total Joint Exercises Ankle Circles/Pumps: Strengthening;Both;15 reps;Supine Quad Sets: 10 reps;Strengthening;Both;Supine Gluteal Sets: Strengthening;15 reps;Supine;Both Short Arc Quad: 10 reps;Right;Supine;Strengthening Heel Slides: 10 reps;Right;Strengthening;Supine (with flexion overpressure) Hip ABduction/ADduction: Both;15 reps;Supine;AROM Straight Leg Raises: Strengthening;Right;15 reps;Supine Goniometric ROM: 3-68 degrees AROM with OP, pain limited    General Comments        Pertinent Vitals/Pain Pain Assessment: 0-10 Pain Score: 3  Pain Location: R knee Pain Intervention(s): Premedicated before session    Home Living                      Prior Function            PT Goals (current  goals can now be found in the care plan section) Acute Rehab PT Goals PT Goal Formulation: With patient Time For Goal Achievement: 04/21/15 Potential to Achieve Goals: Good Progress towards PT goals: Progressing toward goals (slow progression)    Frequency  BID    PT Plan Current plan remains appropriate (RN notified of need for TED hose on RLE)    Co-evaluation              End of Session Equipment Utilized During Treatment: Gait belt Activity Tolerance: Patient limited by pain Patient left: with bed alarm set     Time: [id number] PT Time Calculation (min) (ACUTE ONLY): 35 min  Charges:  $Gait Training: 8-22 mins $Therapeutic Exercise: 8-22 mins                    G Codes:      Lynnea MaizesJason D Tylie Golonka, PT  Maranda Marte 04/10/2015, 11:07 AM

## 2015-04-10 NOTE — Progress Notes (Signed)
Patient is medically stable for D/C to [facility] today. Per Sue LushAndrea admissions coordinator at [facility] patient is going to a private room 212. RN will call report at [phone] and arrange EMS for transport. Clinical Child psychotherapistocial Worker (CSW) prepared D/C packet and sent D[facility]C orders to [facility] via [facility]. Patient is aware of above. Patient's sister Windell MouldingRuth is at bedside and is aware of above. CSW also contacted patient's sister Zollie ScaleOlivia by phone and made her aware of above. Please reconsult if future social work needs arise. CSW signing off.   Jetta LoutBailey Morgan, LCSWA [phone]

## 2015-04-10 NOTE — Discharge Summary (Signed)
Physician Discharge Summary  Subjective: 4 Days Post-Op Procedure(s) (LRB): TOTAL KNEE ARTHROPLASTY (Right) Patient reports pain as mild.   Patient seen in rounds with Dr. Rosita KeaMenz. Patient is well, and has had no acute complaints or problems Patient is ready to go [facility].  Needs BM before discharge.  Patient did well, but very slow with Ambulation and PT.  Bed to Chair yesterday.  Pain under control  Objective: Vital signs in last 24 hours: Temp:  [97.8 F (36.6 C)-98 F (36.7 C)] 97.9 F (36.6 C) (05/16 0446) Pulse Rate:  [68-81] 68 (05/16 0446) Resp:  [18] 18 (05/16 0446) BP: (105-143)/(42-110) 116/42 mmHg (05/16 0446) SpO2:  [96 %-100 %] 96 % (05/16 0446)  Intake/Output from previous day:  Intake/Output Summary (Last 24 hours) at 04/10/15 0643 Last data filed at 04/09/15 1800  Gross per 24 hour  Intake    600 ml  Output      0 ml  Net    600 ml    Intake/Output this shift:    Labs:  Recent Labs  04/08/15 0455  HGB 8.5*   No results for input(s): WBC, RBC, HCT, PLT in the last 72 hours.  Recent Labs  04/08/15 0455  NA 136  K 3.6  CL 108  CO2 25  BUN 13  CREATININE 0.68  GLUCOSE 130*  CALCIUM 8.0*   No results for input(s): LABPT, INR in the last 72 hours.  EXAM: General - Patient is Alert and Oriented Extremity - Neurovascular intact Dorsiflexion/Plantar flexion intact Incision: scant drainage Incision - clean, dry, healing Motor Function - intact, moving foot and toes well on exam.   Assessment/Plan: 4 Days Post-Op Procedure(s) (LRB): TOTAL KNEE ARTHROPLASTY (Right) Procedure(s) (LRB): TOTAL KNEE ARTHROPLASTY (Right) Past Medical History  Diagnosis Date  . Hypertension   . Arthritis    Active Problems:   Primary osteoarthritis of knee  Estimated body mass index is 27.45 kg/(m^2) as calculated from the following:   Height as of this encounter: 5\' 6"  (1.676 m).   Weight as of this encounter: 77.111 kg (170 lb). Discharge to SNF Diet  - Regular diet Follow up - in 2 weeks Activity - WBAT Disposition - Skilled nursing facility Condition Upon Discharge - Good D/C Meds - See DC Summary DVT Prophylaxis - Lovenox, Foot Pumps and TED hose  Whitney Skeensodd Lukisha Procida, PA-C Orthopaedic Surgery 04/10/2015, 6:43 AM

## 2015-04-10 NOTE — Progress Notes (Signed)
Patient discharged to [facility]. Report called to facility. EMS called. Waiting on transportation.

## 2015-04-10 NOTE — Discharge Instructions (Signed)
Knee Rehabilitation, Guidelines Following Surgery °Results after knee surgery are often greatly improved when you follow the exercise, range of motion and muscle strengthening exercises prescribed by your doctor. Safety measures are also important to protect the knee from further injury. Any time any of these exercises cause you to have increased pain or swelling in your knee joint, decrease the amount until you are comfortable again and slowly increase them. If you have problems or questions, call your caregiver or physical therapist for advice. °HOME CARE INSTRUCTIONS  °· Remove items at home which could result in a fall. This includes throw rugs or furniture in walking pathways. °· Continue medications as instructed. °· You may shower or take tub baths when your staples or stitches are removed or as instructed. °· Walk using crutches or walker as instructed. °· Put weight on your legs and walk as much as is comfortable. °· You may resume a sexual relationship in one month or when given the OK by your doctor. °· Return to work as instructed by your doctor. °· Do not drive a car for 6 weeks or as instructed. °· Wear elastic stockings until instructed not to. °· Make sure you keep all of your appointments after your operation with all of your doctors and caregivers. °RANGE OF MOTION AND STRENGTHENING EXERCISES °Rehabilitation of the knee is important following a knee injury or an operation. After just a few days of immobilization, the muscles of the thigh which control the knee become weakened and shrink (atrophy). Knee exercises are designed to build up the tone and strength of the thigh muscles and to improve knee motion. Often times heat used for twenty to thirty minutes before working out will loosen up your tissues and help with improving the range of motion. These exercises can be done on a training (exercise) mat, on the floor, on a table or on a bed. Use what ever works the best and is most comfortable for  you Knee exercises include: °· Leg Lifts - While your knee is still immobilized in a splint or cast, you can do straight leg raises. Lift the leg to 60 degrees, hold for 3 sec, and slowly lower the leg. Repeat 10-20 times 2-3 times daily. Perform this exercise against resistance later as your knee gets better. °· Quad and Hamstring Sets - Tighten up the muscle on the front of the thigh (Quad) and hold for 5-10 sec. Repeat this 10-20 times hourly. Hamstring sets are done by pushing the foot backward against an object and holding for 5-10 sec. Repeat as with quad sets. °· Resistance and Weight exercises - After your knee no longer needs to be immobilized, progressive motion, resistance, and weight lifting exercises should be performed. This is best done under the guidance of a physical therapist. You may safely start with wall squats; with your feet 10 inches from a wall, place your back flat against the wall and lower your trunk about 6 inches until you feel work in your thighs. Hold 20-40 seconds, then rise slowly. Do 3-6 repetitions 2-3 times daily. °· Endurance Training - Bicycle and walking are helpful in restoring strength and endurance to the leg. °A rehabilitation program following serious knee injuries can speed recovery and prevent re-injury in the future due to weakened muscles. Contact your doctor or a physical therapist for more information on knee rehabilitation. °MAKE SURE YOU:  °· Understand these instructions. °· Will watch your condition. °· Will get help right away if you are not doing   well or get worse. °Document Released: 11/11/2005 Document Revised: 02/03/2012 Document Reviewed: 05/01/2007 °[employer]® Patient Information ©2015 [employer]. This information is not intended to replace advice given to you by your health care provider. Make sure you discuss any questions you have with your health care provider. ° °

## 2015-04-11 DIAGNOSIS — M1711 Unilateral primary osteoarthritis, right knee: Secondary | ICD-10-CM

## 2015-05-21 ENCOUNTER — Emergency Department: Payer: Medicare Other

## 2015-05-21 ENCOUNTER — Emergency Department
Admission: EM | Admit: 2015-05-21 | Discharge: 2015-05-21 | Disposition: A | Discharge status: Home / Self Care | Payer: Medicare Other | Attending: Emergency Medicine | Admitting: Emergency Medicine

## 2015-05-21 ENCOUNTER — Encounter: Payer: Self-pay | Admitting: Emergency Medicine

## 2015-05-21 DIAGNOSIS — R2243 Localized swelling, mass and lump, lower limb, bilateral: Secondary | ICD-10-CM | POA: Insufficient documentation

## 2015-05-21 DIAGNOSIS — R110 Nausea: Secondary | ICD-10-CM | POA: Insufficient documentation

## 2015-05-21 DIAGNOSIS — Z880 Allergy status to penicillin: Secondary | ICD-10-CM | POA: Diagnosis not present

## 2015-05-21 DIAGNOSIS — Z79899 Other long term (current) drug therapy: Secondary | ICD-10-CM | POA: Insufficient documentation

## 2015-05-21 DIAGNOSIS — R197 Diarrhea, unspecified: Secondary | ICD-10-CM | POA: Diagnosis present

## 2015-05-21 DIAGNOSIS — I10 Essential (primary) hypertension: Secondary | ICD-10-CM | POA: Diagnosis not present

## 2015-05-21 LAB — CBC
HEMATOCRIT: 27.7 % — AB (ref 35.0–47.0)
Hemoglobin: 9 g/dL — ABNORMAL LOW (ref 12.0–16.0)
MCH: 29.1 pg (ref 26.0–34.0)
MCHC: 32.6 g/dL (ref 32.0–36.0)
MCV: 89.5 fL (ref 80.0–100.0)
Platelets: 417 10*3/uL (ref 150–440)
RBC: 3.09 MIL/uL — ABNORMAL LOW (ref 3.80–5.20)
RDW: 15.3 % — AB (ref 11.5–14.5)
WBC: 6.9 10*3/uL (ref 3.6–11.0)

## 2015-05-21 LAB — LIPASE, BLOOD: Lipase: 36 U/L (ref 22–51)

## 2015-05-21 LAB — COMPREHENSIVE METABOLIC PANEL
ALK PHOS: 100 U/L (ref 38–126)
ALT: 9 U/L — ABNORMAL LOW (ref 14–54)
AST: 14 U/L — ABNORMAL LOW (ref 15–41)
Albumin: 2.9 g/dL — ABNORMAL LOW (ref 3.5–5.0)
Anion gap: 8 (ref 5–15)
BUN: 10 mg/dL (ref 6–20)
CHLORIDE: 107 mmol/L (ref 101–111)
CO2: 26 mmol/L (ref 22–32)
Calcium: 9.1 mg/dL (ref 8.9–10.3)
Creatinine, Ser: 0.56 mg/dL (ref 0.44–1.00)
Glucose, Bld: 104 mg/dL — ABNORMAL HIGH (ref 65–99)
POTASSIUM: 3.5 mmol/L (ref 3.5–5.1)
SODIUM: 141 mmol/L (ref 135–145)
Total Bilirubin: 0.3 mg/dL (ref 0.3–1.2)
Total Protein: 7.1 g/dL (ref 6.5–8.1)

## 2015-05-21 MED ORDER — SODIUM CHLORIDE 0.9 % IV SOLN
1000.0000 mL | Freq: Once | INTRAVENOUS | Status: AC
  Administered 2015-05-21: 1000 mL via INTRAVENOUS

## 2015-05-21 MED ORDER — ONDANSETRON HCL 4 MG/2ML IJ SOLN
4.0000 mg | Freq: Once | INTRAMUSCULAR | Status: AC
  Administered 2015-05-21: 4 mg via INTRAVENOUS

## 2015-05-21 MED ORDER — ONDANSETRON HCL 4 MG/2ML IJ SOLN
INTRAMUSCULAR | Status: AC
  Filled 2015-05-21: qty 2

## 2015-05-21 MED ORDER — ONDANSETRON HCL 4 MG PO TABS: 4.0000 mg | ORAL_TABLET | Freq: Every day | ORAL | Status: DC | PRN

## 2015-05-21 NOTE — ED Provider Notes (Signed)
[facility] Emergency Department Provider Note  ____________________________________________  Time seen: 3 PM  I have reviewed the triage vital signs and the nursing notes.   HISTORY  Chief Complaint Leg Swelling and Diarrhea      HPI Whitney Mcneil is a 81 y.o. female who reports that she developed diarrhea today and now feels slightly queasy. She denies abdominal pain. No vomiting. No fevers or chills. No dysuria. No cough or shortness of breath. No sick contacts. She has had 3 loose stools, nonbloody. She also complains of right leg mild swelling and aching. She had a right knee replacement on May 12 but has not had any difficulties with it thus far. When she noticed it was aching today and she was having diarrhea and felt queasy she decided to come to the hospital but overall though she says she feels pretty good.She has no dysuria.     Past Medical History  Diagnosis Date  . Hypertension   . Arthritis     Patient Active Problem List   Diagnosis Date Noted  . Primary osteoarthritis of knee 04/06/2015    Past Surgical History  Procedure Laterality Date  . Abdominal hysterectomy    . Eye surgery Left     cataract extraction  . Total knee arthroplasty Right 04/06/2015    Procedure: TOTAL KNEE ARTHROPLASTY;  Surgeon: Kennedy Bucker, MD;  Location: [facility] ORS;  Service: Orthopedics;  Laterality: Right;    Current Outpatient Rx  Name  Route  Sig  Dispense  Refill  . acetaminophen (TYLENOL) 325 MG tablet   Oral   Take 2 tablets (650 mg total) by mouth every 6 (six) hours as needed for mild pain (or Fever >/= 101).   30 tablet   0   . amLODipine-benazepril (LOTREL) 10-40 MG per capsule   Oral   Take 1 capsule by mouth daily.         . celecoxib (CELEBREX) 100 MG capsule   Oral   Take 100 mg by mouth 2 (two) times daily as needed for moderate pain.          Marland Kitchen enoxaparin (LOVENOX) 40 MG/0.4ML injection   Subcutaneous   Inject 0.4 mLs (40  mg total) into the skin daily.   14 Syringe   0   . oxyCODONE (OXY IR/ROXICODONE) 5 MG immediate release tablet   Oral   Take 1-2 tablets (5-10 mg total) by mouth every 4 (four) hours as needed for breakthrough pain.   30 tablet   0   . traMADol (ULTRAM) 50 MG tablet   Oral   Take 1 tablet (50 mg total) by mouth every 6 (six) hours as needed.   30 tablet   1     Allergies Celebrex and Penicillins  No family history on file.  Social History History  Substance Use Topics  . Smoking status: Never Smoker   . Smokeless tobacco: Never Used  . Alcohol Use: No    Review of Systems  Constitutional: Negative for fever. Eyes: Negative for visual changes. ENT: Negative for sore throat Cardiovascular: Negative for chest pain. Respiratory: Negative for shortness of breath. Gastrointestinal: Positive for diarrhea Genitourinary: Negative for dysuria. Musculoskeletal: Negative for back pain. Positive for right leg aching Skin: Negative for rash. Neurological: Negative for headaches or focal weakness Psychiatric: No anxiety 10-point ROS otherwise negative.  ____________________________________________   PHYSICAL EXAM:  VITAL SIGNS: BP 136/76 mmHg  Pulse 86  Temp(Src) 97.9 F (36.6 C) (Oral)  Resp 18  Ht 5\' 6"  (1.676 m)  Wt 172 lb (78.019 kg)  BMI 27.77 kg/m2  SpO2 100%  ED Triage Vitals  Enc Vitals Group     BP --      Pulse --      Resp --      Temp --      Temp src --      SpO2 --      Weight --      Height --      Head Cir --      Peak Flow --      Pain Score --      Pain Loc --      Pain Edu? --      Excl. in GC? --      Constitutional: Alert and oriented. Well appearing and in no distress. Eyes: Conjunctivae are normal.  ENT   Head: Normocephalic and atraumatic.   Mouth/Throat: Mucous membranes are moist. Cardiovascular: Normal rate, regular rhythm. Normal and symmetric distal pulses are present in all extremities. No murmurs, rubs, or  gallops. Respiratory: Normal respiratory effort without tachypnea nor retractions. Breath sounds are clear and equal bilaterally.  Gastrointestinal: Soft and non-tender in all quadrants. No distention. There is no CVA tenderness. Genitourinary: deferred Musculoskeletal: 1+ edema bilaterally. No erythema of the right knee. No calf tenderness to palpation. No calf swelling. Normal pulses bilaterally Neurologic:  Normal speech and language. No gross focal neurologic deficits are appreciated. Skin:  Skin is warm, dry and intact. No rash noted. Psychiatric: Mood and affect are normal. Patient exhibits appropriate insight and judgment.  ____________________________________________    LABS (pertinent positives/negatives)  Labs Reviewed  CBC  COMPREHENSIVE METABOLIC PANEL  LIPASE, BLOOD    ____________________________________________   EKG  None  ____________________________________________    RADIOLOGY  Ultrasound shows no DVT  ____________________________________________   PROCEDURES  Procedure(s) performed: none  Critical Care performed: none  ____________________________________________   INITIAL IMPRESSION / ASSESSMENT AND PLAN / ED COURSE  Pertinent labs & imaging results that were available during my care of the patient were reviewed by me and considered in my medical decision making (see chart for details).  Patient overall very well-appearing and cheerful. We will check labs and obtain ultrasound of the right lower extremity. We'll give IV fluids and Zofran for her queasy feeling and then reevaluate  ____________________________________________ Patient is tolerating a sandwich and ginger ale. She feels good. Her labs are all unremarkable (she has chronic anemia). She has no abdominal tenderness to palpation on reexam. She has no back pain. She feels well. We discussed return precautions and she agrees to discharge because she states she is feeling  better.  Patient may be suffering from a mild gastroenteritis. I feel she is safe for discharge.  FINAL CLINICAL IMPRESSION(S) / ED DIAGNOSES  Final diagnoses:  Nausea  Diarrhea     Jene Every, MD 05/21/15 2023

## 2015-05-21 NOTE — Discharge Instructions (Signed)

## 2015-05-21 NOTE — ED Notes (Signed)
Pt remains in ultrasound at this time.

## 2015-05-21 NOTE — ED Notes (Signed)
Pt back from ultrasound at this time. Family at bedside.

## 2015-05-21 NOTE — ED Notes (Signed)
Patient transported to CT 

## 2017-06-09 ENCOUNTER — Ambulatory Visit: Payer: Medicare Other | Admitting: Podiatry

## 2017-06-16 ENCOUNTER — Ambulatory Visit (INDEPENDENT_AMBULATORY_CARE_PROVIDER_SITE_OTHER): Payer: Medicare Other | Admitting: Podiatry

## 2017-06-16 DIAGNOSIS — B351 Tinea unguium: Secondary | ICD-10-CM

## 2017-06-16 DIAGNOSIS — E669 Obesity, unspecified: Secondary | ICD-10-CM | POA: Insufficient documentation

## 2017-06-16 DIAGNOSIS — M79676 Pain in unspecified toe(s): Secondary | ICD-10-CM

## 2017-06-16 DIAGNOSIS — I10 Essential (primary) hypertension: Secondary | ICD-10-CM | POA: Insufficient documentation

## 2017-06-16 DIAGNOSIS — M1990 Unspecified osteoarthritis, unspecified site: Secondary | ICD-10-CM | POA: Insufficient documentation

## 2017-06-16 NOTE — Progress Notes (Signed)
Subjective:     Patient ID: Whitney RidgesCornelia Esters, female   DOB: 01/12/1933, 83 y.o.   MRN: [id number]  HPI this patient presents the office with chief complaint of long thick toenails.  Patient states that she injured her knee and has been unable to self treat.  This she says the nails are painful walking and wearing her shoes.  She presents the office today for an evaluation and treatment of these long painful nails   Review of Systems     Objective:   Physical Exam GENERAL APPEARANCE: Alert, conversant. Appropriately groomed. No acute distress.  VASCULAR: Pedal pulses are  palpable at  DP  bilateral. PT is non palpable due to ankle swelling. Capillary refill time is immediate to all digits,  Normal temperature gradient.  Digital hair growth is present bilateral  NEUROLOGIC: sensation is normal to 5.07 monofilament at 5/5 sites bilateral.  Light touch is intact bilateral, Muscle strength normal.  MUSCULOSKELETAL: acceptable muscle strength, tone and stability bilateral.  Intrinsic muscluature intact bilateral.  Rectus appearance of foot and digits noted bilateral.   DERMATOLOGIC: skin color, texture, and turgor are within normal limits.  No preulcerative lesions or ulcers  are seen, no interdigital maceration noted.  No open lesions present.  Digital nails are asymptomatic. No drainage noted.      Assessment:    Onychomycosis  B/l    Plan:     IE  Debride nails  B/L  RTC 4 months   Helane GuntherGregory Biana Haggar DPM

## 2017-06-16 NOTE — Addendum Note (Signed)
Addended by: Geraldine ContrasVENABLE, ANGELA D on: 06/16/2017 02:36 PM   Modules accepted: Orders

## 2017-10-13 ENCOUNTER — Ambulatory Visit: Payer: Medicare Other | Admitting: Podiatry

## 2017-10-30 ENCOUNTER — Ambulatory Visit: Payer: Medicare Other | Admitting: Podiatry

## 2017-11-06 ENCOUNTER — Ambulatory Visit: Payer: Medicare Other | Admitting: Podiatry

## 2018-02-04 ENCOUNTER — Inpatient Hospital Stay: Admission: RE | Admit: 2018-02-04 | Payer: Medicare Other | Source: Ambulatory Visit

## 2018-02-05 ENCOUNTER — Other Ambulatory Visit: Payer: Self-pay

## 2018-02-05 ENCOUNTER — Encounter
Admission: RE | Admit: 2018-02-05 | Discharge: 2018-02-05 | Disposition: A | Discharge status: Home / Self Care | Payer: Medicare Other | Source: Ambulatory Visit | Attending: Orthopedic Surgery | Admitting: Orthopedic Surgery

## 2018-02-05 DIAGNOSIS — Z01812 Encounter for preprocedural laboratory examination: Secondary | ICD-10-CM | POA: Insufficient documentation

## 2018-02-05 DIAGNOSIS — Z01810 Encounter for preprocedural cardiovascular examination: Secondary | ICD-10-CM | POA: Insufficient documentation

## 2018-02-05 DIAGNOSIS — I10 Essential (primary) hypertension: Secondary | ICD-10-CM | POA: Insufficient documentation

## 2018-02-05 HISTORY — DX: Headache, unspecified: R51.9

## 2018-02-05 HISTORY — DX: Headache: R51

## 2018-02-05 HISTORY — DX: Anemia, unspecified: D64.9

## 2018-02-05 LAB — BASIC METABOLIC PANEL
Anion gap: 9 (ref 5–15)
BUN: 29 mg/dL — AB (ref 6–20)
CALCIUM: 9.8 mg/dL (ref 8.9–10.3)
CO2: 26 mmol/L (ref 22–32)
CREATININE: 0.72 mg/dL (ref 0.44–1.00)
Chloride: 106 mmol/L (ref 101–111)
GFR calc Af Amer: >60 mL/min (ref >60)
GFR calc non Af Amer: >60 mL/min (ref >60)
GLUCOSE: 106 mg/dL — AB (ref 65–99)
Potassium: 4 mmol/L (ref 3.5–5.1)
Sodium: 141 mmol/L (ref 135–145)

## 2018-02-05 LAB — URINALYSIS, ROUTINE W REFLEX MICROSCOPIC
BILIRUBIN URINE: NEGATIVE
GLUCOSE, UA: NEGATIVE mg/dL
Ketones, ur: NEGATIVE mg/dL
NITRITE: NEGATIVE
PH: 5 (ref 5.0–8.0)
Protein, ur: NEGATIVE mg/dL
Specific Gravity, Urine: 1.027 (ref 1.005–1.030)

## 2018-02-05 LAB — CBC WITH DIFFERENTIAL/PLATELET
BASOS PCT: 1 %
Basophils Absolute: 0 10*3/uL (ref 0–0.1)
EOS ABS: 0.1 10*3/uL (ref 0–0.7)
EOS PCT: 2 %
HEMATOCRIT: 36.9 % (ref 35.0–47.0)
Hemoglobin: 12.2 g/dL (ref 12.0–16.0)
Lymphocytes Relative: 32 %
Lymphs Abs: 1.7 10*3/uL (ref 1.0–3.6)
MCH: 31.4 pg (ref 26.0–34.0)
MCHC: 33 g/dL (ref 32.0–36.0)
MCV: 95.4 fL (ref 80.0–100.0)
MONOS PCT: 9 %
Monocytes Absolute: 0.5 10*3/uL (ref 0.2–0.9)
Neutro Abs: 3.1 10*3/uL (ref 1.4–6.5)
Neutrophils Relative %: 56 %
Platelets: 210 10*3/uL (ref 150–440)
RBC: 3.87 MIL/uL (ref 3.80–5.20)
RDW: 14.4 % (ref 11.5–14.5)
WBC: 5.4 10*3/uL (ref 3.6–11.0)

## 2018-02-05 LAB — SEDIMENTATION RATE: SED RATE: 32 mm/h — AB (ref 0–30)

## 2018-02-05 LAB — TYPE AND SCREEN
ABO/RH(D): A POS
ANTIBODY SCREEN: NEGATIVE

## 2018-02-05 LAB — APTT: aPTT: 33 seconds (ref 24–36)

## 2018-02-05 LAB — PROTIME-INR
INR: 0.94
PROTHROMBIN TIME: 12.5 s (ref 11.4–15.2)

## 2018-02-05 LAB — SURGICAL PCR SCREEN
MRSA, PCR: NEGATIVE
STAPHYLOCOCCUS AUREUS: NEGATIVE

## 2018-02-05 NOTE — Patient Instructions (Signed)
Your procedure is scheduled on: Tuesday, MARCH 19TH  Report to THE SECOND FLOOR OF THE MEDICAL MALL  To find out your arrival time please call [phone] between                  1PM - 3PM on Monday, MARCH 18TH  Remember: Instructions that are not followed completely may result in serious  medical risk, up to and including death, or upon the discretion of your surgeon  and anesthesiologist your surgery may need to be rescheduled.     _X__ 1. Do not eat food after midnight the night before your procedure.                 No gum chewing, lozengers or hard candies.                  You may drink clear liquids up to 2 hours before you are scheduled to arrive                  for your surgery- DO not drink clear                 liquids within 2 hours of the start of your surgery.                  Clear Liquids include:  water, apple juice without pulp, clear carbohydrate                 drink such as Clearfast of Gatorade, Black Coffee or Tea (Do not add                 anything to coffee or tea).  __X__2.  On the morning of surgery brush your teeth with toothpaste and water,                      you may rinse your mouth with mouthwash if you wish.                                 Do not swallow any toothpaste of mouthwash.     _X__ 3.  No Alcohol for 24 hours before or after surgery.   _X__ 4.  Do Not Smoke or use e-cigarettes For 24 Hours Prior to Your Surgery.                 Do not use any chewable tobacco products for at least 6 hours prior to                 surgery.  ____  5.  Bring all medications with you on the day of surgery if instructed.   _x___  6.  Notify your doctor if there is any change in your medical condition      (cold, fever, infections).     Do not wear jewelry, make-up, hairpins, clips or nail polish. Do not wear lotions, powders, or perfumes. You may wear deodorant. Do not shave 48 hours prior to surgery. Men may shave face and  neck. Do not bring valuables to the hospital.    [facility] is not responsible for any belongings or valuables.  Contacts, dentures or bridgework may not be worn into surgery. Leave your suitcase in the car. After surgery it may be brought to your room. For patients admitted to the hospital, discharge time is determined by your treatment team.  Patients discharged the day of surgery will not be allowed to drive home.   Please read over the following fact sheets that you were given:   PREPARING FOR SURGERY   ____ Take these medicines the morning of surgery with A SIP OF WATER:    1. AMLODIPINE  2. CELEBREX  3. EYE DROPS  4.  5.  6.  ____ Fleet Enema (as directed)   __X__ Use CHG Soap as directed  __X__ Stop ASPIRIN PRODUCTS AS OF NOW!!              THIS INCLUDES EXCEDRIN / ASPIRIN / GOODYS POWDER/ BC POWDER  _X___ Stop Anti-inflammatories AS OF NOW!!!               THIS INCLUDES IBUPROFEN / MOTRIN / ADVIL / ALEVE   ____ Stop supplements until after surgery.    ____ Bring C-Pap to the hospital.   HAVE STOOL SOFTENERS AT HOME FOR DISCHARGE  WEAR STURDY SHOES (CROC CLOGS ARE OKAY)  WEAR LOOSE FITTING PANTS

## 2018-02-09 LAB — URINE CULTURE: Culture: 80000 — AB

## 2018-02-09 MED ORDER — CLINDAMYCIN PHOSPHATE 900 MG/50ML IV SOLN
900.0000 mg | Freq: Once | INTRAVENOUS | Status: AC
  Administered 2018-02-10: 900 mg via INTRAVENOUS

## 2018-02-09 MED ORDER — TRANEXAMIC ACID 1000 MG/10ML IV SOLN
1000.0000 mg | INTRAVENOUS | Status: AC
  Administered 2018-02-10: 1000 mg via INTRAVENOUS
  Filled 2018-02-09: qty 10

## 2018-02-10 ENCOUNTER — Inpatient Hospital Stay: Payer: Medicare Other

## 2018-02-10 ENCOUNTER — Inpatient Hospital Stay: Payer: Medicare Other | Admitting: Anesthesiology

## 2018-02-10 ENCOUNTER — Inpatient Hospital Stay
Admission: RE | Admit: 2018-02-10 | Discharge: 2018-02-13 | DRG: 470 | Disposition: A | Discharge status: Skilled Nursing Facility | Payer: Medicare Other | Source: Ambulatory Visit | Attending: Orthopedic Surgery | Admitting: Orthopedic Surgery

## 2018-02-10 ENCOUNTER — Other Ambulatory Visit: Payer: Self-pay

## 2018-02-10 ENCOUNTER — Encounter
Admission: RE | Disposition: A | Discharge status: Skilled Nursing Facility | Payer: Self-pay | Source: Ambulatory Visit | Attending: Orthopedic Surgery

## 2018-02-10 DIAGNOSIS — Z888 Allergy status to other drugs, medicaments and biological substances status: Secondary | ICD-10-CM | POA: Diagnosis not present

## 2018-02-10 DIAGNOSIS — Z7982 Long term (current) use of aspirin: Secondary | ICD-10-CM | POA: Diagnosis not present

## 2018-02-10 DIAGNOSIS — Z79899 Other long term (current) drug therapy: Secondary | ICD-10-CM

## 2018-02-10 DIAGNOSIS — I9581 Postprocedural hypotension: Secondary | ICD-10-CM | POA: Diagnosis not present

## 2018-02-10 DIAGNOSIS — D649 Anemia, unspecified: Secondary | ICD-10-CM | POA: Diagnosis present

## 2018-02-10 DIAGNOSIS — Z880 Allergy status to penicillin: Secondary | ICD-10-CM | POA: Diagnosis not present

## 2018-02-10 DIAGNOSIS — T502X5A Adverse effect of carbonic-anhydrase inhibitors, benzothiadiazides and other diuretics, initial encounter: Secondary | ICD-10-CM | POA: Diagnosis not present

## 2018-02-10 DIAGNOSIS — N179 Acute kidney failure, unspecified: Secondary | ICD-10-CM | POA: Diagnosis not present

## 2018-02-10 DIAGNOSIS — M1612 Unilateral primary osteoarthritis, left hip: Secondary | ICD-10-CM | POA: Diagnosis present

## 2018-02-10 DIAGNOSIS — D62 Acute posthemorrhagic anemia: Secondary | ICD-10-CM | POA: Diagnosis not present

## 2018-02-10 DIAGNOSIS — T41205A Adverse effect of unspecified general anesthetics, initial encounter: Secondary | ICD-10-CM | POA: Diagnosis not present

## 2018-02-10 DIAGNOSIS — Z96642 Presence of left artificial hip joint: Secondary | ICD-10-CM

## 2018-02-10 DIAGNOSIS — Z96651 Presence of right artificial knee joint: Secondary | ICD-10-CM | POA: Diagnosis present

## 2018-02-10 DIAGNOSIS — I10 Essential (primary) hypertension: Secondary | ICD-10-CM | POA: Diagnosis present

## 2018-02-10 DIAGNOSIS — Z419 Encounter for procedure for purposes other than remedying health state, unspecified: Secondary | ICD-10-CM

## 2018-02-10 DIAGNOSIS — G8918 Other acute postprocedural pain: Secondary | ICD-10-CM

## 2018-02-10 HISTORY — PX: TOTAL HIP ARTHROPLASTY: SHX124

## 2018-02-10 LAB — CBC
HEMATOCRIT: 34 % — AB (ref 35.0–47.0)
Hemoglobin: 11.2 g/dL — ABNORMAL LOW (ref 12.0–16.0)
MCH: 31.4 pg (ref 26.0–34.0)
MCHC: 33.1 g/dL (ref 32.0–36.0)
MCV: 95 fL (ref 80.0–100.0)
Platelets: 184 10*3/uL (ref 150–440)
RBC: 3.58 MIL/uL — ABNORMAL LOW (ref 3.80–5.20)
RDW: 14.3 % (ref 11.5–14.5)
WBC: 5.6 10*3/uL (ref 3.6–11.0)

## 2018-02-10 LAB — CREATININE, SERUM
Creatinine, Ser: 0.56 mg/dL (ref 0.44–1.00)
GFR calc Af Amer: >60 mL/min (ref >60)

## 2018-02-10 SURGERY — ARTHROPLASTY, HIP, TOTAL, ANTERIOR APPROACH
Anesthesia: Spinal | Laterality: Left

## 2018-02-10 MED ORDER — ZOLPIDEM TARTRATE 5 MG PO TABS: 5.0000 mg | ORAL_TABLET | Freq: Every evening | ORAL | Status: DC | PRN

## 2018-02-10 MED ORDER — ENOXAPARIN SODIUM 40 MG/0.4ML ~~LOC~~ SOLN
40.0000 mg | SUBCUTANEOUS | Status: DC
  Administered 2018-02-11 – 2018-02-13 (×3): 40 mg via SUBCUTANEOUS
  Filled 2018-02-10 (×3): qty 0.4

## 2018-02-10 MED ORDER — FAMOTIDINE 20 MG PO TABS
20.0000 mg | ORAL_TABLET | Freq: Once | ORAL | Status: AC
  Administered 2018-02-10: 20 mg via ORAL

## 2018-02-10 MED ORDER — NEOMYCIN-POLYMYXIN B GU 40-200000 IR SOLN
Status: AC
  Filled 2018-02-10: qty 20

## 2018-02-10 MED ORDER — FENTANYL CITRATE (PF) 100 MCG/2ML IJ SOLN: 25.0000 ug | INTRAMUSCULAR | Status: DC | PRN

## 2018-02-10 MED ORDER — ASPIRIN EC 81 MG PO TBEC
81.0000 mg | DELAYED_RELEASE_TABLET | Freq: Every day | ORAL | Status: DC
  Administered 2018-02-11 – 2018-02-13 (×3): 81 mg via ORAL
  Filled 2018-02-10 (×3): qty 1

## 2018-02-10 MED ORDER — HYDROCODONE-ACETAMINOPHEN 7.5-325 MG PO TABS: 1.0000 | ORAL_TABLET | ORAL | Status: DC | PRN

## 2018-02-10 MED ORDER — CLINDAMYCIN PHOSPHATE 900 MG/50ML IV SOLN
INTRAVENOUS | Status: AC
  Filled 2018-02-10: qty 50

## 2018-02-10 MED ORDER — CLINDAMYCIN PHOSPHATE 600 MG/50ML IV SOLN
600.0000 mg | Freq: Four times a day (QID) | INTRAVENOUS | Status: AC
  Administered 2018-02-10 – 2018-02-11 (×4): 600 mg via INTRAVENOUS
  Filled 2018-02-10 (×4): qty 50

## 2018-02-10 MED ORDER — METOCLOPRAMIDE HCL 10 MG PO TABS: 5.0000 mg | ORAL_TABLET | Freq: Three times a day (TID) | ORAL | Status: DC | PRN

## 2018-02-10 MED ORDER — ONDANSETRON HCL 4 MG PO TABS: 4.0000 mg | ORAL_TABLET | Freq: Four times a day (QID) | ORAL | Status: DC | PRN

## 2018-02-10 MED ORDER — OXYCODONE HCL 5 MG PO TABS: 5.0000 mg | ORAL_TABLET | Freq: Once | ORAL | Status: DC | PRN

## 2018-02-10 MED ORDER — SODIUM CHLORIDE 0.9 % IJ SOLN
INTRAMUSCULAR | Status: AC
  Filled 2018-02-10: qty 10

## 2018-02-10 MED ORDER — ACETAMINOPHEN 325 MG PO TABS: 325.0000 mg | ORAL_TABLET | Freq: Four times a day (QID) | ORAL | Status: DC | PRN

## 2018-02-10 MED ORDER — DOCUSATE SODIUM 100 MG PO CAPS
100.0000 mg | ORAL_CAPSULE | Freq: Two times a day (BID) | ORAL | Status: DC
  Administered 2018-02-10 – 2018-02-13 (×6): 100 mg via ORAL
  Filled 2018-02-10 (×6): qty 1

## 2018-02-10 MED ORDER — BISACODYL 10 MG RE SUPP
10.0000 mg | Freq: Every day | RECTAL | Status: DC | PRN
  Administered 2018-02-12: 10 mg via RECTAL
  Filled 2018-02-10: qty 1

## 2018-02-10 MED ORDER — LIDOCAINE HCL (PF) 2 % IJ SOLN
INTRAMUSCULAR | Status: AC
Start: 2018-02-10 — End: 2018-02-10
  Filled 2018-02-10: qty 10

## 2018-02-10 MED ORDER — SODIUM CHLORIDE 0.9 % IV SOLN
INTRAVENOUS | Status: DC
  Administered 2018-02-10 – 2018-02-13 (×5): via INTRAVENOUS

## 2018-02-10 MED ORDER — FENTANYL CITRATE (PF) 100 MCG/2ML IJ SOLN
INTRAMUSCULAR | Status: AC
  Filled 2018-02-10: qty 2

## 2018-02-10 MED ORDER — BUPIVACAINE-EPINEPHRINE 0.25% -1:200000 IJ SOLN
INTRAMUSCULAR | Status: DC | PRN
Start: 2018-02-10 — End: 2018-02-10
  Administered 2018-02-10: 30 mL

## 2018-02-10 MED ORDER — FAMOTIDINE 20 MG PO TABS
ORAL_TABLET | ORAL | Status: AC
Start: 2018-02-10 — End: 2018-02-10
  Administered 2018-02-10: 20 mg via ORAL
  Filled 2018-02-10: qty 1

## 2018-02-10 MED ORDER — PHENOL 1.4 % MT LIQD
1.0000 | OROMUCOSAL | Status: DC | PRN
  Filled 2018-02-10: qty 177

## 2018-02-10 MED ORDER — PROMETHAZINE HCL 25 MG/ML IJ SOLN: 6.2500 mg | INTRAMUSCULAR | Status: DC | PRN

## 2018-02-10 MED ORDER — METOCLOPRAMIDE HCL 5 MG/ML IJ SOLN: 5.0000 mg | Freq: Three times a day (TID) | INTRAMUSCULAR | Status: DC | PRN

## 2018-02-10 MED ORDER — MEPERIDINE HCL 50 MG/ML IJ SOLN: 6.2500 mg | INTRAMUSCULAR | Status: DC | PRN

## 2018-02-10 MED ORDER — MIDAZOLAM HCL 2 MG/2ML IJ SOLN
INTRAMUSCULAR | Status: AC
  Filled 2018-02-10: qty 2

## 2018-02-10 MED ORDER — FENTANYL CITRATE (PF) 100 MCG/2ML IJ SOLN
INTRAMUSCULAR | Status: DC | PRN
  Administered 2018-02-10 (×2): 25 ug via INTRAVENOUS
  Administered 2018-02-10: 50 ug via INTRAVENOUS

## 2018-02-10 MED ORDER — MORPHINE SULFATE (PF) 2 MG/ML IV SOLN
0.5000 mg | INTRAVENOUS | Status: DC | PRN
  Administered 2018-02-10: 1 mg via INTRAVENOUS
  Filled 2018-02-10: qty 1

## 2018-02-10 MED ORDER — GABAPENTIN 300 MG PO CAPS
300.0000 mg | ORAL_CAPSULE | Freq: Three times a day (TID) | ORAL | Status: DC
  Administered 2018-02-10 – 2018-02-12 (×7): 300 mg via ORAL
  Filled 2018-02-10 (×7): qty 1

## 2018-02-10 MED ORDER — BUPIVACAINE-EPINEPHRINE (PF) 0.25% -1:200000 IJ SOLN
INTRAMUSCULAR | Status: AC
  Filled 2018-02-10: qty 30

## 2018-02-10 MED ORDER — ONDANSETRON HCL 4 MG/2ML IJ SOLN
4.0000 mg | Freq: Four times a day (QID) | INTRAMUSCULAR | Status: DC | PRN
  Administered 2018-02-11: 4 mg via INTRAVENOUS
  Filled 2018-02-10: qty 2

## 2018-02-10 MED ORDER — MAGNESIUM HYDROXIDE 400 MG/5ML PO SUSP: 30.0000 mL | Freq: Every day | ORAL | Status: DC | PRN

## 2018-02-10 MED ORDER — MIDAZOLAM HCL 5 MG/5ML IJ SOLN
INTRAMUSCULAR | Status: DC | PRN
  Administered 2018-02-10 (×2): 1 mg via INTRAVENOUS

## 2018-02-10 MED ORDER — BRIMONIDINE TARTRATE 0.2 % OP SOLN
1.0000 [drp] | Freq: Two times a day (BID) | OPHTHALMIC | Status: DC
  Administered 2018-02-10 – 2018-02-13 (×6): 1 [drp] via OPHTHALMIC
  Filled 2018-02-10: qty 5

## 2018-02-10 MED ORDER — MENTHOL 3 MG MT LOZG
1.0000 | LOZENGE | OROMUCOSAL | Status: DC | PRN
  Filled 2018-02-10: qty 9

## 2018-02-10 MED ORDER — AMLODIPINE BESY-BENAZEPRIL HCL 10-40 MG PO CAPS: 1.0000 | ORAL_CAPSULE | Freq: Every day | ORAL | Status: DC

## 2018-02-10 MED ORDER — MAGNESIUM CITRATE PO SOLN
1.0000 | Freq: Once | ORAL | Status: DC | PRN
  Filled 2018-02-10: qty 296

## 2018-02-10 MED ORDER — METHOCARBAMOL 1000 MG/10ML IJ SOLN
500.0000 mg | Freq: Four times a day (QID) | INTRAVENOUS | Status: DC | PRN
  Filled 2018-02-10: qty 5

## 2018-02-10 MED ORDER — PROPOFOL 500 MG/50ML IV EMUL
INTRAVENOUS | Status: DC | PRN
  Administered 2018-02-10: 50 ug/kg/min via INTRAVENOUS

## 2018-02-10 MED ORDER — DIPHENHYDRAMINE HCL 12.5 MG/5ML PO ELIX: 12.5000 mg | ORAL_SOLUTION | ORAL | Status: DC | PRN

## 2018-02-10 MED ORDER — AMLODIPINE BESYLATE 10 MG PO TABS: 10.0000 mg | ORAL_TABLET | Freq: Every day | ORAL | Status: DC

## 2018-02-10 MED ORDER — METHOCARBAMOL 500 MG PO TABS
500.0000 mg | ORAL_TABLET | Freq: Four times a day (QID) | ORAL | Status: DC | PRN
  Administered 2018-02-10: 500 mg via ORAL
  Filled 2018-02-10: qty 1

## 2018-02-10 MED ORDER — EPHEDRINE SULFATE 50 MG/ML IJ SOLN
INTRAMUSCULAR | Status: AC
  Filled 2018-02-10: qty 1

## 2018-02-10 MED ORDER — EPHEDRINE SULFATE 50 MG/ML IJ SOLN
INTRAMUSCULAR | Status: DC | PRN
  Administered 2018-02-10: 10 mg via INTRAVENOUS
  Administered 2018-02-10: 20 mg via INTRAVENOUS
  Administered 2018-02-10 (×2): 10 mg via INTRAVENOUS

## 2018-02-10 MED ORDER — BENAZEPRIL HCL 40 MG PO TABS
40.0000 mg | ORAL_TABLET | Freq: Every day | ORAL | Status: DC
  Filled 2018-02-10 (×2): qty 1

## 2018-02-10 MED ORDER — SODIUM CHLORIDE 0.9 % IV SOLN
INTRAVENOUS | Status: DC | PRN
  Administered 2018-02-10: 25 ug/min via INTRAVENOUS

## 2018-02-10 MED ORDER — BUPIVACAINE HCL (PF) 0.5 % IJ SOLN
INTRAMUSCULAR | Status: DC | PRN
  Administered 2018-02-10: 3 mL

## 2018-02-10 MED ORDER — OXYCODONE HCL 5 MG/5ML PO SOLN: 5.0000 mg | Freq: Once | ORAL | Status: DC | PRN

## 2018-02-10 MED ORDER — PROPOFOL 10 MG/ML IV BOLUS
INTRAVENOUS | Status: AC
  Filled 2018-02-10: qty 40

## 2018-02-10 MED ORDER — HYDROCODONE-ACETAMINOPHEN 5-325 MG PO TABS
1.0000 | ORAL_TABLET | ORAL | Status: DC | PRN
  Administered 2018-02-10 – 2018-02-11 (×2): 1 via ORAL
  Filled 2018-02-10 (×2): qty 1

## 2018-02-10 MED ORDER — ALUM & MAG HYDROXIDE-SIMETH 200-200-20 MG/5ML PO SUSP: 30.0000 mL | ORAL | Status: DC | PRN

## 2018-02-10 MED ORDER — TRAMADOL HCL 50 MG PO TABS
50.0000 mg | ORAL_TABLET | Freq: Four times a day (QID) | ORAL | Status: DC
  Administered 2018-02-11 – 2018-02-13 (×11): 50 mg via ORAL
  Filled 2018-02-10 (×11): qty 1

## 2018-02-10 MED ORDER — LACTATED RINGERS IV SOLN
INTRAVENOUS | Status: DC
  Administered 2018-02-10: 14:00:00 via INTRAVENOUS

## 2018-02-10 MED ORDER — NEOMYCIN-POLYMYXIN B GU 40-200000 IR SOLN
Status: DC | PRN
  Administered 2018-02-10: 4 mL

## 2018-02-10 SURGICAL SUPPLY — 56 items
BLADE SAW SAG 18.5X105 (BLADE) ×3 IMPLANT
BNDG COHESIVE 6X5 TAN STRL LF (GAUZE/BANDAGES/DRESSINGS) ×9 IMPLANT
CANISTER SUCT 1200ML W/VALVE (MISCELLANEOUS) ×3 IMPLANT
CHLORAPREP W/TINT 26ML (MISCELLANEOUS) ×6 IMPLANT
DRAPE C-ARM XRAY 36X54 (DRAPES) ×3 IMPLANT
DRAPE INCISE IOBAN 66X45 STRL (DRAPES) ×3 IMPLANT
DRAPE INCISE IOBAN 66X60 STRL (DRAPES) ×3 IMPLANT
DRAPE POUCH INSTRU U-SHP 10X18 (DRAPES) ×3 IMPLANT
DRAPE SHEET LG 3/4 BI-LAMINATE (DRAPES) ×9 IMPLANT
DRAPE TABLE BACK 80X90 (DRAPES) ×3 IMPLANT
DRESSING SURGICEL FIBRLLR 1X2 (HEMOSTASIS) ×2 IMPLANT
DRSG OPSITE POSTOP 4X8 (GAUZE/BANDAGES/DRESSINGS) IMPLANT
DRSG SURGICEL FIBRILLAR 1X2 (HEMOSTASIS) ×6
ELECT BLADE 6.5 EXT (BLADE) ×3 IMPLANT
ELECT REM PT RETURN 9FT ADLT (ELECTROSURGICAL) ×3
ELECTRODE REM PT RTRN 9FT ADLT (ELECTROSURGICAL) ×1 IMPLANT
EVACUATOR 1/8 PVC DRAIN (DRAIN) IMPLANT
GLOVE BIOGEL PI IND STRL 9 (GLOVE) ×4 IMPLANT
GLOVE BIOGEL PI INDICATOR 9 (GLOVE) ×8
GLOVE SURG SYN 9.0  PF PI (GLOVE) ×6
GLOVE SURG SYN 9.0 PF PI (GLOVE) ×3 IMPLANT
GOWN SRG 2XL LVL 4 RGLN SLV (GOWNS) ×1 IMPLANT
GOWN STRL NON-REIN 2XL LVL4 (GOWNS) ×2
GOWN STRL REUS W/ TWL LRG LVL3 (GOWN DISPOSABLE) ×2 IMPLANT
GOWN STRL REUS W/TWL LRG LVL3 (GOWN DISPOSABLE) ×4
HIP DBL LINER 54X28 (Liner) ×3 IMPLANT
HIP FEM HD M 28 (Head) ×3 IMPLANT
HOLDER FOLEY CATH W/STRAP (MISCELLANEOUS) ×3 IMPLANT
HOOD PEEL AWAY FLYTE STAYCOOL (MISCELLANEOUS) ×6 IMPLANT
KIT PREVENA INCISION MGT 13 (CANNISTER) ×3 IMPLANT
MAT BLUE FLOOR 46X72 FLO (MISCELLANEOUS) ×3 IMPLANT
NDL SAFETY ECLIPSE 18X1.5 (NEEDLE) ×1 IMPLANT
NEEDLE HYPO 18GX1.5 SHARP (NEEDLE) ×2
NEEDLE SPNL 18GX3.5 QUINCKE PK (NEEDLE) ×3 IMPLANT
NS IRRIG 1000ML POUR BTL (IV SOLUTION) ×3 IMPLANT
PACK HIP COMPR (MISCELLANEOUS) ×3 IMPLANT
SHELL ACETABULAR SZ 54 DM (Shell) ×3 IMPLANT
SOL PREP PVP 2OZ (MISCELLANEOUS)
SOLUTION PREP PVP 2OZ (MISCELLANEOUS) IMPLANT
SPONGE DRAIN TRACH 4X4 STRL 2S (GAUZE/BANDAGES/DRESSINGS) ×3 IMPLANT
STAPLER SKIN PROX 35W (STAPLE) ×3 IMPLANT
STEM FEMORAL SZ3 LAT COLLARED (Stem) ×3 IMPLANT
STRAP SAFETY 5IN WIDE (MISCELLANEOUS) ×3 IMPLANT
SUT DVC 2 QUILL PDO  T11 36X36 (SUTURE) ×2
SUT DVC 2 QUILL PDO T11 36X36 (SUTURE) ×1 IMPLANT
SUT SILK 0 (SUTURE) ×2
SUT SILK 0 30XBRD TIE 6 (SUTURE) ×1 IMPLANT
SUT V-LOC 90 ABS DVC 3-0 CL (SUTURE) ×3 IMPLANT
SUT VIC AB 1 CT1 36 (SUTURE) ×3 IMPLANT
SYR 20CC LL (SYRINGE) ×3 IMPLANT
SYR 30ML LL (SYRINGE) ×3 IMPLANT
SYR BULB IRRIG 60ML STRL (SYRINGE) ×3 IMPLANT
TAPE MICROFOAM 4IN (TAPE) ×3 IMPLANT
TOWEL OR 17X26 4PK STRL BLUE (TOWEL DISPOSABLE) ×3 IMPLANT
TRAY FOLEY W/METER SILVER 16FR (SET/KITS/TRAYS/PACK) ×3 IMPLANT
WND VAC CANISTER 500ML (MISCELLANEOUS) ×3 IMPLANT

## 2018-02-10 NOTE — H&P (Signed)
Reviewed paper H+P, will be scanned into chart. No changes noted.  

## 2018-02-10 NOTE — Op Note (Addendum)
02/10/2018  5:30 PM  PATIENT:  Whitney Mcneil  84 y.o. female  PRE-OPERATIVE DIAGNOSIS:  Left hip pain left hip severe osteoarthritis  POST-OPERATIVE DIAGNOSIS:  Left hip pain same  PROCEDURE:  Procedure(s): TOTAL HIP ARTHROPLASTY ANTERIOR APPROACH (Left)  SURGEON: Leitha SchullerMichael J Kaisei Gilbo, MD  ASSISTANTS: None  ANESTHESIA:   spinal  EBL:  Total I/O In: -  Out: 500 [Urine:200; Blood:300]  BLOOD ADMINISTERED:none  DRAINS: none   LOCAL MEDICATIONS USED:  MARCAINE     SPECIMEN:  Source of Specimen:  Left femoral head portion  DISPOSITION OF SPECIMEN:  PATHOLOGY  COUNTS:  YES  TOURNIQUET:  * No tourniquets in log *  IMPLANTS: Medacta, AMIS 3 lateralized, 54 mm Mpact DM with liner and M 28 mm metal head  DICTATION: .Dragon Dictation   The patient was brought to the operating room and after spinal anesthesia was obtained patient was placed on the operative table with the ipsilateral foot into the Medacta attachment, contralateral leg on a well-padded table. C-arm was brought in and preop template x-ray taken. After prepping and draping in usual sterile fashion appropriate patient identification and timeout procedures were completed. Anterior approach to the hip was obtained and centered over the greater trochanter and TFL muscle. The subcutaneous tissue was incised hemostasis being achieved by electrocautery. TFL fascia was incised and the muscle retracted laterally deep retractor placed. The lateral femoral circumflex vessels were identified and ligated. The anterior capsule was exposed and a capsulotomy performed. The neck was identified and a femoral neck cut carried out with a saw. The head was removed without difficulty and showed sclerotic femoral head and acetabulum. Reaming was carried out to 52 mm and a 54  mm cup trial gave appropriate tightness to the acetabular component a 54 DM cup was impacted into position. The leg was then externally rotated and ischiofemoral and patellofemoral  releases carried out.  With extension of the leg the greater trochanter suffered a fracture and was stable to palpation and stress and was left in place without additional fixation applied the femur was sequentially broached to a size 3, size 3 lateralized with S and then millimeters head trials were placed and the final components chosen. The 3 lateralized stem was inserted along with a M metal 28 mm head and 54 mm liner. The hip was reduced and was stable the wound was thoroughly irrigated with fibrillar placed on the posterior neck and capsulotomy incisions. The deep fascia was closed using a heavy Quill after infiltration of 30 cc of quarter percent Sensorcaine with epinephrine. 3-0 v-loc subcutaneously and skin with staples followed by incisional wound VAC  PLAN OF CARE: Admit to inpatient

## 2018-02-10 NOTE — Transfer of Care (Signed)
Immediate Anesthesia Transfer of Care Note  Patient: Whitney Mcneil  Procedure(s) Performed: TOTAL HIP ARTHROPLASTY ANTERIOR APPROACH (Left )  Patient Location: PACU  Anesthesia Type:Spinal  Level of Consciousness: drowsy and patient cooperative  Airway & Oxygen Therapy: Patient Spontanous Breathing and Patient connected to nasal cannula oxygen  Post-op Assessment: Report given to RN and Post -op Vital signs reviewed and stable  Post vital signs: Reviewed and stable  Last Vitals:  Vitals:   02/10/18 1347 02/10/18 1734  BP: (!) 141/61 (!) 113/56  Pulse: 81 68  Resp: 20 20  Temp:  36.4 C  SpO2: 100% 96%    Last Pain:  Vitals:   02/10/18 1347  TempSrc:   PainSc: 2          Complications: No apparent anesthesia complications

## 2018-02-10 NOTE — Anesthesia Preprocedure Evaluation (Signed)
Anesthesia Evaluation  Patient identified by MRN, date of birth, ID band Patient awake    Reviewed: Allergy & Precautions, NPO status , Patient's Chart, lab work & pertinent test results  History of Anesthesia Complications Negative for: history of anesthetic complications  Airway Mallampati: II  TM Distance: >3 FB Neck ROM: Full    Dental  (+) Edentulous Upper, Missing   Pulmonary neg pulmonary ROS, neg sleep apnea, neg COPD,    breath sounds clear to auscultation- rhonchi (-) wheezing      Cardiovascular hypertension, Pt. on medications (-) CAD, (-) Past MI, (-) Cardiac Stents and (-) CABG  Rhythm:Regular Rate:Normal - Systolic murmurs and - Diastolic murmurs    Neuro/Psych  Headaches, negative psych ROS   GI/Hepatic negative GI ROS, Neg liver ROS,   Endo/Other  negative endocrine ROSneg diabetes  Renal/GU negative Renal ROS     Musculoskeletal  (+) Arthritis ,   Abdominal (+) - obese,   Peds  Hematology  (+) anemia ,   Anesthesia Other Findings Past Medical History: No date: Anemia     Comment:  hx of b12 deficiency No date: Arthritis No date: Headache No date: Hypertension   Reproductive/Obstetrics                             Lab Results  Component Value Date   WBC 5.4 02/05/2018   HGB 12.2 02/05/2018   HCT 36.9 02/05/2018   MCV 95.4 02/05/2018   PLT 210 02/05/2018    Anesthesia Physical Anesthesia Plan  ASA: II  Anesthesia Plan: Spinal   Post-op Pain Management:    Induction:   PONV Risk Score and Plan: 2 and Propofol infusion  Airway Management Planned: Natural Airway  Additional Equipment:   Intra-op Plan:   Post-operative Plan:   Informed Consent: I have reviewed the patients History and Physical, chart, labs and discussed the procedure including the risks, benefits and alternatives for the proposed anesthesia with the patient or authorized representative  who has indicated his/her understanding and acceptance.   Dental advisory given  Plan Discussed with: CRNA and Anesthesiologist  Anesthesia Plan Comments:         Anesthesia Quick Evaluation

## 2018-02-10 NOTE — Anesthesia Post-op Follow-up Note (Signed)
Anesthesia QCDR form completed.        

## 2018-02-10 NOTE — Anesthesia Procedure Notes (Signed)
Spinal  Patient location during procedure: OR Start time: 02/10/2018 3:48 PM End time: 02/10/2018 3:53 PM Staffing Anesthesiologist: Molli Barrows, MD Resident/CRNA: Jonna Clark, CRNA Performed: resident/CRNA  Preanesthetic Checklist Completed: patient identified, site marked, surgical consent, pre-op evaluation, timeout performed, IV checked, risks and benefits discussed and monitors and equipment checked Spinal Block Patient position: sitting Prep: Betadine Patient monitoring: heart rate, continuous pulse ox, blood pressure and cardiac monitor Approach: midline Location: L4-5 Injection technique: single-shot Needle Needle type: Whitacre and Introducer  Needle gauge: 24 G Needle length: 9 cm Additional Notes Negative paresthesia. Negative blood return. Positive free-flowing CSF. Expiration date of kit checked and confirmed. Patient tolerated procedure well, without complications.

## 2018-02-11 ENCOUNTER — Encounter
Admission: RE | Admit: 2018-02-11 | Discharge: 2018-02-11 | Disposition: A | Discharge status: Home / Self Care | Payer: Medicare Other | Source: Ambulatory Visit | Attending: Internal Medicine | Admitting: Internal Medicine

## 2018-02-11 ENCOUNTER — Encounter: Payer: Self-pay | Admitting: Orthopedic Surgery

## 2018-02-11 MED ORDER — SODIUM CHLORIDE 0.9 % IV BOLUS (SEPSIS)
500.0000 mL | Freq: Once | INTRAVENOUS | Status: AC
  Administered 2018-02-11: 500 mL via INTRAVENOUS

## 2018-02-11 NOTE — Progress Notes (Signed)
Patient BP 86/56. MD notified. Orders received 

## 2018-02-11 NOTE — Progress Notes (Signed)
   Subjective: 1 Day Post-Op Procedure(s) (LRB): TOTAL HIP ARTHROPLASTY ANTERIOR APPROACH (Left) Patient reports pain as 0 on 0-10 scale.   Patient is well, and has had no acute complaints or problems Denies any CP, SOB, ABD pain. We will continue therapy today.  Plan is to go Skilled nursing facility after hospital stay.  Objective: Vital signs in last 24 hours: Temp:  [94.7 F (34.8 C)-98.8 F (37.1 C)] 98.3 F (36.8 C) (03/20 0307) Pulse Rate:  [55-81] 74 (03/20 0307) Resp:  [17-20] 19 (03/20 0307) BP: (101-141)/(41-66) 109/41 (03/20 0307) SpO2:  [94 %-100 %] 100 % (03/20 0307) Weight:  [77.1 kg (170 lb)] 77.1 kg (170 lb) (03/19 1347)  Intake/Output from previous day: 03/19 0701 - 03/20 0700 In: 1835 [P.O.:120; I.V.:1615; IV Piggyback:100] Out: 1300 [Urine:950; Drains:50; Blood:300] Intake/Output this shift: No intake/output data recorded.  Recent Labs    02/10/18 1849  HGB 11.2*   Recent Labs    02/10/18 1849  WBC 5.6  RBC 3.58*  HCT 34.0*  PLT 184   Recent Labs    02/10/18 1849  CREATININE 0.56   No results for input(s): LABPT, INR in the last 72 hours.  EXAM General - Patient is Alert, Appropriate and Oriented Extremity - Neurovascular intact Sensation intact distally Intact pulses distally Dorsiflexion/Plantar flexion intact No cellulitis present Compartment soft Dressing - dressing C/D/I and no drainage. Hemovac and wound vac intact. 50cc drainage Motor Function - intact, moving foot and toes well on exam.   Past Medical History:  Diagnosis Date  . Anemia    hx of b12 deficiency  . Arthritis   . Headache   . Hypertension     Assessment/Plan:   1 Day Post-Op Procedure(s) (LRB): TOTAL HIP ARTHROPLASTY ANTERIOR APPROACH (Left) Active Problems:   Status post total hip replacement, left  Estimated body mass index is 27.44 kg/m as calculated from the following:   Height as of this encounter: 5\' 6"  (1.676 m).   Weight as of this  encounter: 77.1 kg (170 lb). Advance diet Up with therapy  Needs BM Recheck labs in the am Hold BP Norvasc this am.  CM to assist with discharge to SNF   DVT Prophylaxis - Lovenox, Foot Pumps and TED hose Weight-Bearing as tolerated to left leg   T. Cranston Neighborhris Gaines, PA-C [facility] 02/11/2018, 7:46 AM

## 2018-02-11 NOTE — Clinical Social Work Note (Signed)
Clinical Social Work Assessment  Patient Details  Name: Wynonna Fitzhenry MRN: [id number] Date of Birth: 1932/12/13  Date of referral:  02/11/18               Reason for consult:  Facility Placement                Permission sought to share information with:  Chartered certified accountant granted to share information::  Yes, Verbal Permission Granted  Name::      [place]::   [place]   Relationship::     Contact Information:     Housing/Transportation Living arrangements for the past 2 months:  [place] of Information:  Patient Patient Interpreter Needed:  None Criminal Activity/Legal Involvement Pertinent to Current Situation/Hospitalization:  No - Comment as needed Significant Relationships:  Adult Children Lives with:  Self Do you feel safe going back to the place where you live?  Yes Need for family participation in patient care:  Yes (Comment)  Care giving concerns:  Patient lives alone in [place].    Social Worker assessment / plan:  Holiday representative (CSW) received SNF consult. PT is recommending SNF. CSW met with patient alone at bedside to discuss D/C plan. Patient was alert and oriented X4 and was sitting up in the chair at bedside. CSW introduced self and explained role of CSW department. Patient reported that she lives alone and has 2 adult daughters and 1 adult son that live out of state. CSW explained SNF process and that medicare requires a 3 night qualifying inpatient stay in a hospital in order to pay for SNF. Patient was admitted to inpatient on 02/10/18. Patient verbalized her understanding and is agreeable to SNF search in [place]. FL2 complete and faxed out.   CSW presented bed offers to patient. She prefers a private room but is agreeable to a semi-private room at [facility]. [facility] admissions coordinator at [facility] is aware of above. CSW faxed H&P to [facility].   Plan is for patient  to D/C to [facility] Friday 02/13/18 pending medical clearance.   Employment status:  Retired Forensic scientist:  [employer] PT Recommendations:  [place] / Referral to community resources:  [place]  Patient/Family's Response to care:  Patient chose [employer]   Patient/Family's Understanding of and Emotional Response to Diagnosis, Current Treatment, and Prognosis:  Patient was very pleasant and thanked CSW for assistance.   Emotional Assessment Appearance:  Appears stated age Attitude/Demeanor/Rapport:    Affect (typically observed):  Accepting, Adaptable, Pleasant Orientation:  Oriented to Self, Oriented to Place, Oriented to Situation, Oriented to  Time Alcohol / Substance use:  Not Applicable Psych involvement (Current and /or in the community):  No (Comment)  Discharge Needs  Concerns to be addressed:  Discharge Planning Concerns Readmission within the last 30 days:  No Current discharge risk:  Dependent with Mobility Barriers to Discharge:  Continued Medical Work up   [employer], Veronia Beets, LCSW 02/11/2018, 1:21 PM

## 2018-02-11 NOTE — Anesthesia Postprocedure Evaluation (Signed)
Anesthesia Post Note  Patient: Whitney Mcneil  Procedure(s) Performed: TOTAL HIP ARTHROPLASTY ANTERIOR APPROACH (Left )  Patient location during evaluation: Nursing Unit Anesthesia Type: Spinal Level of consciousness: awake and alert Pain management: satisfactory to patient Vital Signs Assessment: post-procedure vital signs reviewed and stable Respiratory status: respiratory function stable Cardiovascular status: stable Postop Assessment: no backache, no headache, spinal receding, no apparent nausea or vomiting, patient able to bend at knees and adequate PO intake Anesthetic complications: no     Last Vitals:  Vitals:   02/11/18 0307 02/11/18 0816  BP: (!) 109/41 (!) 112/48  Pulse: 74 78  Resp: 19   Temp: 36.8 C 36.9 C  SpO2: 100% 98%    Last Pain:  Vitals:   02/11/18 0816  TempSrc: Oral  PainSc: 5                  Clydene PughBeane, Anaaya Fuster D

## 2018-02-11 NOTE — Evaluation (Signed)
Physical Therapy Evaluation Patient Details Name: Whitney RidgesCornelia Mcneil MRN: [id number] DOB: 12/01/1932 Today's Date: 02/11/2018   History of Present Illness  Pt admitted s/p L anterior hip replacement.  PMH includes Htn, HA, arthritis and anemia.  Clinical Impression  Pt is an 84 year old female who is independent with use of RW at baseline.  Pt is in bed upon PT arrival and requires mod A to sit at EOB and heavy VC's for scooting along EOB as well as assistance to bring LE's over EOB.  Pt presents with generalized weakness of BUE and L LE but WNL strength of R LE.  Pt reports no sensation loss.  She requires mod A for initiation of STS and VC's for safe use of RW.  Pt appears to be very fearful of falling.  PT performed standing pivot transfer with bed elevated Max A with VC's for pt to assist with LE's and direction.  Pt reported 6/10 pain following transfer.  Pt will continue to benefit from skilled PT with focus on strength, pain management, safe use of AD, functional mobility and tolerance to activity.    Follow Up Recommendations SNF    Equipment Recommendations       Recommendations for Other Services       Precautions / Restrictions Precautions Precautions: Anterior Hip Restrictions Weight Bearing Restrictions: Yes LLE Weight Bearing: Weight bearing as tolerated      Mobility  Bed Mobility Overal bed mobility: Needs Assistance Bed Mobility: Supine to Sit     Supine to sit: Mod assist     General bed mobility comments: Pt was able to initiate transfer but PT provided mod A to complete supine to sit and scooting to EOB.  Pt was able to scoot to the R side along EOB with heavy VC's from PT.  Pt demonstrates fear of falling and avoids flexing trunk forward when sitting at EOB.  Grasps RW for security.  Transfers Overall transfer level: Needs assistance Equipment used: Rolling walker (2 wheeled) Transfers: Sit to/from [employer]Stand;Stand Pivot Transfers Sit to Stand: Mod assist Stand  pivot transfers: Max assist       General transfer comment: Pt requires manual assist to initiate STS on two attempts; Max a standing pivot transfer with pt able to engage LE's to assist with transfer.  Pt has a fear of falling and requires VC's to avoid grasping RW during transfer.  Ambulation/Gait Ambulation/Gait assistance: (Did not perform.  Unable to assess.)              Stairs            Wheelchair Mobility    Modified Rankin (Stroke Patients Only)       Balance Overall balance assessment: Needs assistance Sitting-balance support: Bilateral upper extremity supported;Feet supported       Standing balance support: Bilateral upper extremity supported                                 Pertinent Vitals/Pain Pain Assessment: 0-10 Pain Score: 5  Pain Location: L hip    Home Living Family/patient expects to be discharged to:: Skilled nursing facility Living Arrangements: Alone                    Prior Function Level of Independence: Independent with assistive device(s)         Comments: Pt uses a RW.  Had one fall last year in which she  slid down the side of her couch. Pt is able to drive and needs help getting groceries.  Uses riding cart in grocery store.     Hand Dominance   Dominant Hand: Right    Extremity/Trunk Assessment   Upper Extremity Assessment Upper Extremity Assessment: Generalized weakness    Lower Extremity Assessment Lower Extremity Assessment: Generalized weakness(Pt reports sensation intact bilaterally.)    Cervical / Trunk Assessment Cervical / Trunk Assessment: Kyphotic  Communication   Communication: No difficulties  Cognition Arousal/Alertness: Awake/alert Behavior During Therapy: WFL for tasks assessed/performed Overall Cognitive Status: Within Functional Limits for tasks assessed                                        General Comments      Exercises     Assessment/Plan     PT Assessment Patient needs continued PT services  PT Problem List Decreased strength;Decreased mobility;Decreased activity tolerance;Decreased balance;Decreased range of motion;Decreased knowledge of use of DME;Pain       PT Treatment Interventions DME instruction;Therapeutic activities;Gait training;Therapeutic exercise;Stair training;Balance training;Functional mobility training;Neuromuscular re-education;Patient/family education    PT Goals (Current goals can be found in the Care Plan section)  Acute Rehab PT Goals Patient Stated Goal: to go to SNF and return to walking with RW as soon as possible. PT Goal Formulation: With patient Time For Goal Achievement: 02/25/18 Potential to Achieve Goals: Good    Frequency BID   Barriers to discharge        Co-evaluation               AM-PAC PT "6 Clicks" Daily Activity  Outcome Measure Difficulty turning over in bed (including adjusting bedclothes, sheets and blankets)?: A Little Difficulty moving from lying on back to sitting on the side of the bed? : A Lot Difficulty sitting down on and standing up from a chair with arms (e.g., wheelchair, bedside commode, etc,.)?: A Lot Help needed moving to and from a bed to chair (including a wheelchair)?: A Lot Help needed walking in hospital room?: A Lot Help needed climbing 3-5 steps with a railing? : Total 6 Click Score: 12    End of Session Equipment Utilized During Treatment: Gait belt Activity Tolerance: Patient limited by fatigue;Patient limited by pain Patient left: in chair;with call bell/phone within reach;with chair alarm set;with family/visitor present Nurse Communication: Mobility status PT Visit Diagnosis: Unsteadiness on feet (R26.81);Muscle weakness (generalized) (M62.81);History of falling (Z91.81);Pain Pain - Right/Left: Left Pain - part of body: Hip    Time: 0950-1020 PT Time Calculation (min) (ACUTE ONLY): 30 min   Charges:   PT Evaluation $PT Eval Moderate  Complexity: 1 Mod PT Treatments $Therapeutic Activity: 8-22 mins   PT G Codes:   PT G-Codes **NOT FOR INPATIENT CLASS** Functional Assessment Tool Used: AM-PAC 6 Clicks Basic Mobility    Glenetta Hew, PT, DPT   Glenetta Hew 02/11/2018, 10:36 AM

## 2018-02-11 NOTE — [place] FL2 (Signed)
[place] MEDICAID FL2 LEVEL OF CARE SCREENING TOOL     IDENTIFICATION  Patient Name: Whitney Mcneil Birthdate: 12/12/1932 Sex: female Admission Date (Current Location): 02/10/2018  [place]   Facility and Address:  [facility], [address], [place], [place] [address]      Provider Number: [id number]  Attending Physician Name and Address:  Kennedy BuckerMenz, Michael, MD  Relative Name and Phone Number:       Current Level of Care: Hospital Recommended Level of Care: Skilled Nursing Facility Prior Approval Number:    Date Approved/Denied:   PASRR Number: [id number] )  Discharge Plan: SNF    Current Diagnoses: Patient Active Problem List   Diagnosis Date Noted  . Status post total hip replacement, left 02/10/2018  . Arthritis 06/16/2017  . Hypertension 06/16/2017  . Obesity, unspecified 06/16/2017  . Primary osteoarthritis of one knee 04/06/2015  . Murmur, cardiac 03/10/2013  . Abnormal EKG 10/16/2012    Orientation RESPIRATION BLADDER Height & Weight     Self, Time, Situation, Place  Normal Continent Weight: 170 lb (77.1 kg) Height:  5\' 6"  (167.6 cm)  BEHAVIORAL SYMPTOMS/MOOD NEUROLOGICAL BOWEL NUTRITION STATUS      Continent Diet(Regular Diet. )  AMBULATORY STATUS COMMUNICATION OF NEEDS Skin   Extensive Assist Verbally Surgical wounds, Wound Vac(Incicion Left hip, provena wound vac. )                       Personal Care Assistance Level of Assistance  Bathing, Feeding, Dressing Bathing Assistance: Limited assistance Feeding assistance: Independent Dressing Assistance: Limited assistance     Functional Limitations Info  Sight, Hearing, Speech Sight Info: Adequate Hearing Info: Adequate Speech Info: Adequate    SPECIAL CARE FACTORS FREQUENCY  PT (By licensed PT), OT (By licensed OT)     PT Frequency: (5) OT Frequency: (5)            Contractures      Additional Factors Info  Code Status,  Allergies Code Status Info: (Full Code. ) Allergies Info: (Penicillins, Celebrex Celecoxib)           Current Medications (02/11/2018):  This is the current hospital active medication list Current Facility-Administered Medications  Medication Dose Route Frequency Provider Last Rate Last Dose  . 0.9 %  sodium chloride infusion   Intravenous Continuous Kennedy BuckerMenz, Michael, MD 100 mL/hr at 02/11/18 0321    . acetaminophen (TYLENOL) tablet 325-650 mg  325-650 mg Oral Q6H PRN Kennedy BuckerMenz, Michael, MD      . alum & mag hydroxide-simeth (MAALOX/MYLANTA) 200-200-20 MG/5ML suspension 30 mL  30 mL Oral Q4H PRN Kennedy BuckerMenz, Michael, MD      . amLODipine (NORVASC) tablet 10 mg  10 mg Oral Daily Kennedy BuckerMenz, Michael, MD       And  . benazepril (LOTENSIN) tablet 40 mg  40 mg Oral Daily Kennedy BuckerMenz, Michael, MD      . aspirin EC tablet 81 mg  81 mg Oral Daily Kennedy BuckerMenz, Michael, MD      . bisacodyl (DULCOLAX) suppository 10 mg  10 mg Rectal Daily PRN Kennedy BuckerMenz, Michael, MD      . brimonidine (ALPHAGAN) 0.2 % ophthalmic solution 1 drop  1 drop Right Eye BID Kennedy BuckerMenz, Michael, MD   1 drop at 02/11/18 [phone]  . clindamycin (CLEOCIN) IVPB 600 mg  600 mg Intravenous Q6H Kennedy BuckerMenz, Michael, MD 100 mL/hr at 02/11/18 0819 600 mg at 02/11/18 0819  . diphenhydrAMINE (BENADRYL) 12.5 MG/5ML elixir  12.5-25 mg  12.5-25 mg Oral Q4H PRN Kennedy Bucker, MD      . docusate sodium (COLACE) capsule 100 mg  100 mg Oral BID Kennedy Bucker, MD   100 mg at 02/10/18 2132  . enoxaparin (LOVENOX) injection 40 mg  40 mg Subcutaneous Q24H Kennedy Bucker, MD   40 mg at 02/11/18 0820  . gabapentin (NEURONTIN) capsule 300 mg  300 mg Oral TID Kennedy Bucker, MD   300 mg at 02/10/18 2132  . HYDROcodone-acetaminophen (NORCO) 7.5-325 MG per tablet 1-2 tablet  1-2 tablet Oral Q4H PRN Kennedy Bucker, MD      . HYDROcodone-acetaminophen (NORCO/VICODIN) 5-325 MG per tablet 1-2 tablet  1-2 tablet Oral Q4H PRN Kennedy Bucker, MD   1 tablet at 02/10/18 1937  . magnesium citrate solution 1 Bottle  1 Bottle  Oral Once PRN Kennedy Bucker, MD      . magnesium hydroxide (MILK OF MAGNESIA) suspension 30 mL  30 mL Oral Daily PRN Kennedy Bucker, MD      . menthol-cetylpyridinium (CEPACOL) lozenge 3 mg  1 lozenge Oral PRN Kennedy Bucker, MD       Or  . phenol (CHLORASEPTIC) mouth spray 1 spray  1 spray Mouth/Throat PRN Kennedy Bucker, MD      . methocarbamol (ROBAXIN) tablet 500 mg  500 mg Oral Q6H PRN Kennedy Bucker, MD   500 mg at 02/10/18 2132   Or  . methocarbamol (ROBAXIN) 500 mg in dextrose 5 % 50 mL IVPB  500 mg Intravenous Q6H PRN Kennedy Bucker, MD      . metoCLOPramide (REGLAN) tablet 5-10 mg  5-10 mg Oral Q8H PRN Kennedy Bucker, MD       Or  . metoCLOPramide (REGLAN) injection 5-10 mg  5-10 mg Intravenous Q8H PRN Kennedy Bucker, MD      . morphine 2 MG/ML injection 0.5-1 mg  0.5-1 mg Intravenous Q2H PRN Kennedy Bucker, MD   1 mg at 02/10/18 2127  . ondansetron (ZOFRAN) tablet 4 mg  4 mg Oral Q6H PRN Kennedy Bucker, MD       Or  . ondansetron [facility]) injection 4 mg  4 mg Intravenous Q6H PRN Kennedy Bucker, MD   4 mg at 02/11/18 1610  . traMADol (ULTRAM) tablet 50 mg  50 mg Oral Q6H Kennedy Bucker, MD   50 mg at 02/11/18 0454  . zolpidem (AMBIEN) tablet 5 mg  5 mg Oral QHS PRN Kennedy Bucker, MD         Discharge Medications: Please see discharge summary for a list of discharge medications.  Relevant Imaging Results:  Relevant Lab Results:   Additional Information (SSN: [id number])  Whitney Mcneil, Whitney Crocker, LCSW

## 2018-02-11 NOTE — Clinical Social Work Placement (Signed)
   CLINICAL SOCIAL WORK PLACEMENT  NOTE  Date:  02/11/2018  Patient Details  Name: Whitney Mcneil MRN: [id number] Date of Birth: 04/10/1933  Clinical Social Work is seeking post-discharge placement for this patient at the Skilled  Nursing Facility level of care (*CSW will initial, date and re-position this form in  chart as items are completed):  Yes   Patient/family provided with [place] Clinical Social Work Department's list of facilities offering this level of care within the geographic area requested by the patient (or if unable, by the patient's family).  Yes   Patient/family informed of their freedom to choose among providers that offer the needed level of care, that participate in Medicare, Medicaid or managed care program needed by the patient, have an available bed and are willing to accept the patient.  Yes   Patient/family informed of [facility]'s ownership interest in [facility] and [facility], as well as of the fact that they are under no obligation to receive care at these facilities.  PASRR submitted to EDS on       PASRR number received on       Existing PASRR number confirmed on 02/11/18     FL2 transmitted to all facilities in geographic area requested by pt/family on 02/11/18     FL2 transmitted to all facilities within larger geographic area on       Patient informed that his/her managed care company has contracts with or will negotiate with certain facilities, including the following:        Yes   Patient/family informed of bed offers received.  Patient chooses bed at [facility] )     Physician recommends and patient chooses bed at      Patient to be transferred to   on  .  Patient to be transferred to facility by       Patient family notified on   of transfer.  Name of family member notified:        PHYSICIAN       Additional Comment:    _______________________________________________ Gilbert Narain, Darleen CrockerBailey M, LCSW 02/11/2018, 1:21  PM

## 2018-02-11 NOTE — Progress Notes (Signed)
Physical Therapy Treatment Patient Details Name: Whitney Mcneil MRN: [id number] DOB: 07/29/1933 Today's Date: 02/11/2018    History of Present Illness Pt admitted s/p L anterior hip replacement.  PMH includes Htn, HA, arthritis and anemia.    PT Comments    Pt in recliner upon PT arrival.  PT introduced seated ther ex to pt and pt was able to perform full sets of 10 with some manual assist.  Pt progressed to performing standing pivot transfer from recliner to bed with RW with assistance from PT and RN.  Pt required VC's and manual cues for body mechanics and advancement of limb due to pt being fearful of falling.  Pt is very determined to participate and follows directions provided.  Pt requires frequent VC's for upright posture and proper hand placement on RW.  Pt was provided assistance to bring LE's over EOB, to direct pt into lying postion and to scoot up in bed.  PT issued and reviewed an HEP with LE exercises and anterior hip precautions, emphasizing importance of frequency of exercise for recovery.  Pr reported pain increase in L hip following transfer and RN provided pain medication.  Pt will continue to benefit from skilled PT with focus on strength, tolerance to activity, functional mobility and safe use of RW.  Follow Up Recommendations  SNF     Equipment Recommendations       Recommendations for Other Services       Precautions / Restrictions Precautions Precautions: Anterior Hip Restrictions Weight Bearing Restrictions: Yes LLE Weight Bearing: Weight bearing as tolerated    Mobility  Bed Mobility Overal bed mobility: Needs Assistance Bed Mobility: Supine to Sit     Supine to sit: Mod assist     General bed mobility comments: Pt required assistance for direction when lying in bed and with LE's to bring over EOB.  PT and RN scooted pt up in bed total A with drawsheet.  Transfers Overall transfer level: Needs assistance Equipment used: Rolling walker (2  wheeled) Transfers: Sit to/from [employer]Stand;Stand Pivot Transfers Sit to Stand: Max assist Stand pivot transfers: Max assist;+2 physical assistance       General transfer comment: Pt requires VC's for upright posture, hand placement, manual cues for advancing L LE and wt shift laterally to initiate advancement of limb.  Pt is determined to regain mobility and participates but is very fearful of falling.  Pt transferred with RW and required VC's from PT to keep hands in proper place on RW.  Ambulation/Gait Ambulation/Gait assistance: (Did not perform.)               Stairs            Wheelchair Mobility    Modified Rankin (Stroke Patients Only)       Balance Overall balance assessment: Needs assistance Sitting-balance support: Bilateral upper extremity supported;Feet supported       Standing balance support: Bilateral upper extremity supported                                Cognition Arousal/Alertness: Awake/alert Behavior During Therapy: WFL for tasks assessed/performed Overall Cognitive Status: Within Functional Limits for tasks assessed                                        Exercises Total Joint Exercises Ankle Circles/Pumps: AROM;20 reps;Seated  Quad Sets: Strengthening;Left Gluteal Sets: 10 reps;Seated(with manual cues from PT.) Towel Squeeze: Strengthening;10 reps;Seated;Both Heel Slides: AAROM;Left;10 reps;Seated Hip ABduction/ADduction: Strengthening;Left;10 reps;Seated Other Exercises Other Exercises: Issued and reviewed HEP with anterior hip precautions.  Provided education concerning importance of frequency of exercise.  Pt expressed understanding.    General Comments        Pertinent Vitals/Pain Pain Assessment: 0-10 Pain Score: 3  Pain Location: L hip Pain Descriptors / Indicators: Aching Pain Intervention(s): Limited activity within patient's tolerance;Monitored during session    Home Living                       Prior Function            PT Goals (current goals can now be found in the care plan section) Acute Rehab PT Goals Patient Stated Goal: To go home and be able to make her own bed. PT Goal Formulation: With patient Time For Goal Achievement: 02/25/18 Potential to Achieve Goals: Good Progress towards PT goals: Progressing toward goals    Frequency    BID      PT Plan Current plan remains appropriate    Co-evaluation              AM-PAC PT "6 Clicks" Daily Activity  Outcome Measure  Difficulty turning over in bed (including adjusting bedclothes, sheets and blankets)?: A Lot Difficulty moving from lying on back to sitting on the side of the bed? : A Lot Difficulty sitting down on and standing up from a chair with arms (e.g., wheelchair, bedside commode, etc,.)?: A Lot Help needed moving to and from a bed to chair (including a wheelchair)?: A Lot Help needed walking in hospital room?: A Lot Help needed climbing 3-5 steps with a railing? : Total 6 Click Score: 11    End of Session Equipment Utilized During Treatment: Gait belt   Patient left: in bed Nurse Communication: Mobility status PT Visit Diagnosis: Unsteadiness on feet (R26.81);Muscle weakness (generalized) (M62.81);Pain Pain - Right/Left: Left Pain - part of body: Hip     Time: 1500-1540 PT Time Calculation (min) (ACUTE ONLY): 40 min  Charges:  $Therapeutic Exercise: 8-22 mins $Therapeutic Activity: 23-37 mins                    G Codes:  Functional Assessment Tool Used: AM-PAC 6 Clicks Basic Mobility    Glenetta Hew, PT, DPT    Glenetta Hew 02/11/2018, 4:12 PM

## 2018-02-12 LAB — URINALYSIS, COMPLETE (UACMP) WITH MICROSCOPIC
BILIRUBIN URINE: NEGATIVE
Bacteria, UA: NONE SEEN
Glucose, UA: NEGATIVE mg/dL
Hgb urine dipstick: NEGATIVE
Ketones, ur: NEGATIVE mg/dL
Leukocytes, UA: NEGATIVE
Nitrite: NEGATIVE
PH: 5 (ref 5.0–8.0)
Protein, ur: 30 mg/dL — AB
SPECIFIC GRAVITY, URINE: 1.023 (ref 1.005–1.030)

## 2018-02-12 LAB — SURGICAL PATHOLOGY

## 2018-02-12 LAB — BASIC METABOLIC PANEL
ANION GAP: 7 (ref 5–15)
BUN: 22 mg/dL — ABNORMAL HIGH (ref 6–20)
CALCIUM: 8.4 mg/dL — AB (ref 8.9–10.3)
CO2: 23 mmol/L (ref 22–32)
Chloride: 105 mmol/L (ref 101–111)
Creatinine, Ser: 1.48 mg/dL — ABNORMAL HIGH (ref 0.44–1.00)
GFR, EST AFRICAN AMERICAN: 37 mL/min — AB (ref >60)
GFR, EST NON AFRICAN AMERICAN: 32 mL/min — AB (ref >60)
Glucose, Bld: 125 mg/dL — ABNORMAL HIGH (ref 65–99)
Potassium: 3.9 mmol/L (ref 3.5–5.1)
Sodium: 135 mmol/L (ref 135–145)

## 2018-02-12 LAB — CBC
HCT: 27 % — ABNORMAL LOW (ref 35.0–47.0)
Hemoglobin: 9 g/dL — ABNORMAL LOW (ref 12.0–16.0)
MCH: 31.8 pg (ref 26.0–34.0)
MCHC: 33.4 g/dL (ref 32.0–36.0)
MCV: 95.4 fL (ref 80.0–100.0)
PLATELETS: 162 10*3/uL (ref 150–440)
RBC: 2.83 MIL/uL — ABNORMAL LOW (ref 3.80–5.20)
RDW: 14.3 % (ref 11.5–14.5)
WBC: 10 10*3/uL (ref 3.6–11.0)

## 2018-02-12 MED ORDER — FE FUMARATE-B12-VIT C-FA-IFC PO CAPS
1.0000 | ORAL_CAPSULE | Freq: Two times a day (BID) | ORAL | Status: DC
  Administered 2018-02-12 – 2018-02-13 (×3): 1 via ORAL
  Filled 2018-02-12 (×4): qty 1

## 2018-02-12 MED ORDER — SODIUM CHLORIDE 0.9 % IV SOLN: INTRAVENOUS | Status: DC

## 2018-02-12 NOTE — Consult Note (Signed)
[facility] - [place] at [facility]   PATIENT NAME: Whitney Mcneil    MR#:  [id number]  DATE OF BIRTH:  Oct 05, 1933  DATE OF ADMISSION:  02/10/2018  PRIMARY CARE PHYSICIAN: Rafael Bihari, MD   REQUESTING/REFERRING PHYSICIAN: Dr Rosita Kea  CHIEF COMPLAINT:  Elevated creatinine low blood pressure  HISTORY OF PRESENT ILLNESS:  Whitney Mcneil  is a 84 y.o. female with a known history of hypertension, arthritis is status post Day 2 elective total left hemiarthroplasty.  Internal medicine was consulted since patient's creatinine is rising and hypotension. Patient was hypotensive in the 80s yesterday.  She is much better today blood pressure systolic is 131.  She is feeling better.  Bladder scan showed urine 294 cc.  PAST MEDICAL HISTORY:   Past Medical History:  Diagnosis Date  . Anemia    hx of b12 deficiency  . Arthritis   . Headache   . Hypertension     PAST SURGICAL HISTOIRY:   Past Surgical History:  Procedure Laterality Date  . ABDOMINAL HYSTERECTOMY    . EYE SURGERY Left    cataract extraction  . TOTAL HIP ARTHROPLASTY Left 02/10/2018   Procedure: TOTAL HIP ARTHROPLASTY ANTERIOR APPROACH;  Surgeon: Kennedy Bucker, MD;  Location: [facility] ORS;  Service: Orthopedics;  Laterality: Left;  . TOTAL KNEE ARTHROPLASTY Right 04/06/2015   Procedure: TOTAL KNEE ARTHROPLASTY;  Surgeon: Kennedy Bucker, MD;  Location: [facility] ORS;  Service: Orthopedics;  Laterality: Right;    SOCIAL HISTORY:   Social History   Tobacco Use  . Smoking status: Never Smoker  . Smokeless tobacco: Never Used  Substance Use Topics  . Alcohol use: No    FAMILY HISTORY:  History reviewed. No pertinent family history.  DRUG ALLERGIES:   Allergies  Allergen Reactions  . Penicillins Hives and Rash    Swelling with difficulty breathing  . Celebrex [Celecoxib] Rash and Other (See Comments)    At higher dose Pt only gets a rash when it is a high dose.      REVIEW OF SYSTEMS:    Review of Systems  Constitutional: Negative for chills, fever and weight loss.  HENT: Negative for ear discharge, ear pain and nosebleeds.   Eyes: Negative for blurred vision, pain and discharge.  Respiratory: Negative for sputum production, shortness of breath, wheezing and stridor.   Cardiovascular: Negative for chest pain, palpitations, orthopnea and PND.  Gastrointestinal: Negative for abdominal pain, diarrhea, nausea and vomiting.  Genitourinary: Negative for frequency and urgency.  Musculoskeletal: Negative for back pain and joint pain.  Neurological: Negative for sensory change, speech change, focal weakness and weakness.  Psychiatric/Behavioral: Negative for depression and hallucinations. The patient is not nervous/anxious.    MEDICATIONS AT HOME:   Prior to Admission medications   Medication Sig Start Date End Date Taking? Authorizing Provider  acetaminophen (TYLENOL) 325 MG tablet Take by mouth. 04/10/15  Yes [provider]  amLODipine-benazepril (LOTREL) 10-40 MG capsule Take by mouth. 11/27/16  Yes [provider]  aspirin EC 81 MG tablet Take by mouth.   Yes [provider]  brimonidine (ALPHAGAN) 0.2 % ophthalmic solution Place into the right eye 2 (two) times daily.   Yes [provider]  celecoxib (CELEBREX) 100 MG capsule Take 100 mg by mouth 2 (two) times daily.   Yes [provider]      VITAL SIGNS:  Blood pressure (!) 131/47, pulse 75, temperature 98.4 F (36.9 C), temperature source Oral, resp. rate 17, height 5'  6" (1.676 m), weight 77.1 kg (170 lb), SpO2 96 %.  PHYSICAL EXAMINATION:  GENERAL:  84 y.o.-year-old patient lying in the bed with no acute distress.  EYES: Pupils equal, round, reactive to light and accommodation. No scleral icterus. Extraocular muscles intact.  HEENT: Head atraumatic, normocephalic. Oropharynx and nasopharynx clear.  NECK:  Supple, no jugular venous distention. No thyroid enlargement, no  tenderness.  LUNGS: Normal breath sounds bilaterally, no wheezing, rales,rhonchi or crepitation. No use of accessory muscles of respiration.  CARDIOVASCULAR: S1, S2 normal. No murmurs, rubs, or gallops.  ABDOMEN: Soft, nontender, nondistended. Bowel sounds present. No organomegaly or mass.  EXTREMITIES: No pedal edema, cyanosis, or clubbing.  NEUROLOGIC: Cranial nerves II through XII are intact. Muscle strength 5/5 in all extremities. Sensation intact. Gait not checked.  PSYCHIATRIC: The patient is alert and oriented x 3.  SKIN: No obvious rash, lesion, or ulcer.   LABORATORY PANEL:   CBC Recent Labs  Lab 02/12/18 0415  WBC 10.0  HGB 9.0*  HCT 27.0*  PLT 162   ------------------------------------------------------------------------------------------------------------------  Chemistries  Recent Labs  Lab 02/12/18 0415  NA 135  K 3.9  CL 105  CO2 23  GLUCOSE 125*  BUN 22*  CREATININE 1.48*  CALCIUM 8.4*   ------------------------------------------------------------------------------------------------------------------  Cardiac Enzymes No results for input(s): TROPONINI in the last 168 hours. ------------------------------------------------------------------------------------------------------------------  RADIOLOGY:  Dg Hip Operative Unilat W Or W/o Pelvis Left  Result Date: 02/10/2018 CLINICAL DATA:  84 year old female for left hip replacement. Initial encounter. EXAM: OPERATIVE left HIP (WITH PELVIS IF PERFORMED) 2 VIEWS TECHNIQUE: Fluoroscopic spot image(s) were submitted for interpretation post-operatively. COMPARISON:  None. FINDINGS: Portion of the left hip prosthesis is imaged. The portions which is imaged appears in satisfactory position. Follow-up imaging recommended. IMPRESSION: Post left hip replacement incompletely assessed. Electronically Signed   By: Lacy Duverney M.D.   On: 02/10/2018 17:51   Dg Hip Unilat W Or W/o Pelvis 2-3 Views Left  Result Date:  02/10/2018 CLINICAL DATA:  Left hip arthroplasty. EXAM: DG HIP (WITH OR WITHOUT PELVIS) 2-3V LEFT COMPARISON:  Intraoperative radiographs from the same day. FINDINGS: Left hip hemiarthroplasty is present. The hip is located. Skin staples are in place. Advanced degenerative changes are present at the right hip. IMPRESSION: Left hip hemiarthroplasty without radiographic evidence for complication. Electronically Signed   By: Marin Roberts M.D.   On: 02/10/2018 18:16    EKG:   Orders placed or performed during the hospital encounter of 02/05/18  . EKG 12-Lead  . EKG 12-Lead    IMPRESSION AND PLAN:   Murial Minkler  is a 84 y.o. female with a known history of hypertension, arthritis is status post Day 2 elective total left hemiarthroplasty.  Internal medicine was consulted since patient's creatinine is rising and hypotension.  1.  Hypotension--postop in the setting of anesthesia and oral home meds -We will hold blood pressure meds today -BP much improved than yesterday -Continue to monitor.  Patient is asymptomatic  2.  Acute renal failure in the setting of diuretic, hypotension, postop and anesthesia -Monitor I's and O's, avoid nephrotoxin -Creatinine baseline is 0.56 -Creatinine today is 1.48 -Continue IV fluids -Bladder scan and PRN in and out if needed -Patient encouraged to drink fluids  3.  Postop day 2 left hemiarthroplasty secondary to DJD -Continue physical therapy  4.  DVT prophylaxis subcu Lovenox  D/w chris gains All the records are reviewed and case discussed with Consulting provider. Management plans discussed with the patient, family and they  are in agreement.  CODE STATUS: Full  TOTAL TIME TAKING CARE OF THIS PATIENT: 45 minutes.    Enedina FinnerSona Yalonda Sample M.D on 02/12/2018 at 9:02 AM  Between 7am to 6pm - Pager - [phone]  After 6pm go to [email or web address] - password [email or web address]  [facility]  Office  [phone]  CC: Primary care Physician:  Rafael BihariWalker, John B III, MD

## 2018-02-12 NOTE — Progress Notes (Signed)
Pt received 500cc NS bolus as ordered. Blood pressure remains low. NS at 100cc/hr restarted.

## 2018-02-12 NOTE — Progress Notes (Signed)
Physical Therapy Treatment Patient Details Name: Whitney Mcneil MRN: [id number] DOB: 01/17/1933 Today's Date: 02/12/2018    History of Present Illness Pt admitted s/p L anterior hip replacement.  PMH includes Htn, HA, arthritis and anemia.    PT Comments    Pt reported 5/10 pain today and stated throughout treatment that she is experiencing more soreness today than yesterday.  Pt was in bed and on bedpan when PT arrived.  PT removed bedpan and offered to place pt back on bedpan following treatment as pt has had difficulty with voiding. Pt declined.  Pt was able to complete therapeutic exercises, listed in note, with manual and VC's from PT with report of moderate pain increase.  PT assisted pt to EOB with mod-max hand held A to bring LE's over EOB and for upright posture.  Pt was able to perform sitting balance at EOB for 3-5 min for a blood pressure reading.  She reported no dizziness throughout.  Pt O2 sats remained above 90% throughout treatment with pt on 2L of O2.  Pt will continue to benefit from skilled PT with focus on strength, tolerance to activity, functional mobility, pain management and safe use of AD.   Follow Up Recommendations  SNF     Equipment Recommendations       Recommendations for Other Services       Precautions / Restrictions Precautions Precautions: Anterior Hip Restrictions Weight Bearing Restrictions: Yes LLE Weight Bearing: Weight bearing as tolerated    Mobility  Bed Mobility Overal bed mobility: Needs Assistance Bed Mobility: Supine to Sit;Sit to Supine     Supine to sit: Max assist Sit to supine: Max assist   General bed mobility comments: Pt states that her L LE is in more pain today and is very vocal about pain throughout transfer.  PT assisted pt to EOB and pt was able to scoot minimally to her L side.  Pt assisted in scooting up in bed with BUE and partial bridge.  Transfers Overall transfer level: (Did not perform.)                   Ambulation/Gait Ambulation/Gait assistance: (Did not perform.)               Stairs            Wheelchair Mobility    Modified Rankin (Stroke Patients Only)       Balance Overall balance assessment: Needs assistance Sitting-balance support: Bilateral upper extremity supported;Feet supported                                        Cognition Arousal/Alertness: Awake/alert Behavior During Therapy: WFL for tasks assessed/performed Overall Cognitive Status: Within Functional Limits for tasks assessed                                        Exercises Total Joint Exercises Ankle Circles/Pumps: AROM;Both;20 reps;Supine Quad Sets: Strengthening;Left;10 reps;Supine Gluteal Sets: Strengthening;10 reps;Supine Towel Squeeze: Strengthening;Both;10 reps;Supine Short Arc Quad: Strengthening;Left;10 reps;Supine Heel Slides: AAROM;Left;10 reps;Supine Other Exercises Other Exercises: Partial bridge x10 with VC's from PT for placement of LE's.    General Comments        Pertinent Vitals/Pain Pain Assessment: 0-10 Pain Score: 5  Pain Location: L LE from hip to lateral knee Pain Descriptors /  Indicators: Aching    Home Living                      Prior Function            PT Goals (current goals can now be found in the care plan section) Progress towards PT goals: PT to reassess next treatment    Frequency    BID      PT Plan Current plan remains appropriate    Co-evaluation              AM-PAC PT "6 Clicks" Daily Activity  Outcome Measure  Difficulty turning over in bed (including adjusting bedclothes, sheets and blankets)?: A Lot Difficulty moving from lying on back to sitting on the side of the bed? : A Lot Difficulty sitting down on and standing up from a chair with arms (e.g., wheelchair, bedside commode, etc,.)?: A Lot Help needed moving to and from a bed to chair (including a wheelchair)?: A  Lot Help needed walking in hospital room?: A Lot Help needed climbing 3-5 steps with a railing? : Total 6 Click Score: 11    End of Session Equipment Utilized During Treatment: Oxygen Activity Tolerance: Patient limited by pain Patient left: in bed;with call bell/phone within reach;with bed alarm set   PT Visit Diagnosis: Muscle weakness (generalized) (M62.81);Pain Pain - Right/Left: Left Pain - part of body: Hip     Time: 4098-1191 PT Time Calculation (min) (ACUTE ONLY): 32 min  Charges:  $Therapeutic Exercise: 8-22 mins $Therapeutic Activity: 8-22 mins                    G Codes:  Functional Assessment Tool Used: AM-PAC 6 Clicks Basic Mobility    Glenetta Hew, PT, DPT    Glenetta Hew 02/12/2018, 11:48 AM

## 2018-02-12 NOTE — Progress Notes (Signed)
PT Cancellation Note  Patient Details Name: Cherylynn RidgesCornelia Manganelli MRN: [id number] DOB: 01/05/1933   Cancelled Treatment:    Reason Eval/Treat Not Completed: Patient declined, no reason specified.  Pt currently on bedpan and has had some difficulty voiding.  Pt will re-attempt at a later time.   Glenetta HewSarah Oluwadarasimi Favor, PT, DPT 02/12/2018, 10:52 AM

## 2018-02-12 NOTE — Progress Notes (Signed)
Plan is for patient to D/C to [facility] tomorrow pending medical clearance. Michelle admissions coordinator at [facility] is aware of above.   Orelia Brandstetter, LCSW [phone]  

## 2018-02-12 NOTE — Progress Notes (Signed)
Physical Therapy Treatment Patient Details Name: Whitney Mcneil MRN: [id number] DOB: 10/02/1933 Today's Date: 02/12/2018    History of Present Illness Pt admitted s/p L anterior hip replacement.  PMH includes Htn, HA, arthritis and anemia.    PT Comments    Pt was in bed upon PT arrival and appeared to be in better spirits due to positive report from physician.  She reported that her pain was a 3/10 and that therapy was supposed to come and help her get into her chair.  Pt did not appear to recognize PT when reminded that that is who she was speaking of.  Pt required max A for bed mobility and appeared to be more fearful of falling during transfer as compared to yesterday.  RN was called in to assist and pt required max A with VC's for sequencing and assistance in backing to chair.  Pt did demonstrate better foot clearance and ability to advance limbs during transfer today as compared to yesterday.  Pt reported no pain increase following transfer and was able to perform some seated exercises with manual and verbal cuing.  Pt will continue to benefit from skilled PT with focus on strength, tolerance to activity, pain management, functional mobility and safe use of RW.   Follow Up Recommendations  SNF     Equipment Recommendations       Recommendations for Other Services       Precautions / Restrictions Precautions Precautions: Anterior Hip Restrictions Weight Bearing Restrictions: Yes LLE Weight Bearing: Weight bearing as tolerated    Mobility  Bed Mobility Overal bed mobility: Needs Assistance Bed Mobility: Supine to Sit     Supine to sit: Max assist     General bed mobility comments: PT assisted pt in bringing LE's over EOB and scooting to EOB.  Transfers Overall transfer level: Needs assistance Equipment used: Rolling walker (2 wheeled) Transfers: Sit to/from [employer]Stand;Stand Pivot Transfers Sit to Stand: Max assist;+2 physical assistance Stand pivot transfers: Max assist;+2  physical assistance       General transfer comment: Pt appeared more fearful today and would close her eyes and vocalize fear of falling with each attempt to initiate STS.  RN came in to assist and pt was able to STS and pivot with VC's for upright posture and heavy VC's for advancement of limbs with forward and backward walking.  Pt did demonstrate better physical control of LE's when advancing during gait as compared to yesterday.  Ambulation/Gait                 Stairs            Wheelchair Mobility    Modified Rankin (Stroke Patients Only)       Balance Overall balance assessment: Needs assistance Sitting-balance support: Bilateral upper extremity supported;Feet supported                                        Cognition Arousal/Alertness: Awake/alert Behavior During Therapy: WFL for tasks assessed/performed Overall Cognitive Status: Within Functional Limits for tasks assessed                                        Exercises Total Joint Exercises Quad Sets: Strengthening;Left;10 reps;Seated(Pt able to demonstrate better understanding and greater quadriceps contraction as compared to previous treatments.) Long  Arc Quad: Left;10 reps;AAROM;Seated    General Comments        Pertinent Vitals/Pain Pain Score: 3  Pain Location: L hip    Home Living                      Prior Function            PT Goals (current goals can now be found in the care plan section) Acute Rehab PT Goals Patient Stated Goal: To go to SNF and regain prior mobility. PT Goal Formulation: With patient Time For Goal Achievement: 02/26/18 Potential to Achieve Goals: Fair Progress towards PT goals: Progressing toward goals(Pt made minor progress in gait mechanics during transfer.)    Frequency    BID      PT Plan Current plan remains appropriate    Co-evaluation              AM-PAC PT "6 Clicks" Daily Activity  Outcome  Measure  Difficulty turning over in bed (including adjusting bedclothes, sheets and blankets)?: A Lot Difficulty moving from lying on back to sitting on the side of the bed? : A Lot Difficulty sitting down on and standing up from a chair with arms (e.g., wheelchair, bedside commode, etc,.)?: A Lot Help needed moving to and from a bed to chair (including a wheelchair)?: A Lot Help needed walking in hospital room?: A Lot Help needed climbing 3-5 steps with a railing? : Total 6 Click Score: 11    End of Session Equipment Utilized During Treatment: Gait belt;Oxygen Activity Tolerance: Patient limited by fatigue;Patient limited by pain Patient left: in chair;with call bell/phone within reach;with chair alarm set Nurse Communication: Mobility status PT Visit Diagnosis: Unsteadiness on feet (R26.81);Muscle weakness (generalized) (M62.81);History of falling (Z91.81);Pain Pain - Right/Left: Left Pain - part of body: Hip     Time: 5366-4403 PT Time Calculation (min) (ACUTE ONLY): 30 min  Charges:  $Therapeutic Exercise: 8-22 mins $Therapeutic Activity: 8-22 mins                    G Codes:  Functional Assessment Tool Used: AM-PAC 6 Clicks Basic Mobility    Glenetta Hew, PT, DPT    Glenetta Hew 02/12/2018, 6:47 PM

## 2018-02-12 NOTE — Progress Notes (Signed)
Pt has not voided this shift. Bladder scan shows 294 volume. Pt states she has the urge to void, but is unable to

## 2018-02-12 NOTE — Progress Notes (Signed)
   Subjective: 2 Days Post-Op Procedure(s) (LRB): TOTAL HIP ARTHROPLASTY ANTERIOR APPROACH (Left) Patient reports pain as 0 on 0-10 scale.   Patient is well, and has had no acute complaints or problems. Unable to void today, Bladder scan showing 294. Total input +1500. Denies any CP, SOB, ABD pain. We will continue therapy today.  Plan is to go Skilled nursing facility after hospital stay.  Objective: Vital signs in last 24 hours: Temp:  [98.4 F (36.9 C)-98.6 F (37 C)] 98.4 F (36.9 C) (03/21 0739) Pulse Rate:  [73-80] 75 (03/21 0739) Resp:  [16-18] 17 (03/21 0739) BP: (86-131)/(37-56) 131/47 (03/21 0739) SpO2:  [87 %-96 %] 96 % (03/21 0743)  Intake/Output from previous day: 03/20 0701 - 03/21 0700 In: 1500 [P.O.:360; I.V.:1040; IV Piggyback:100] Out: 0  Intake/Output this shift: No intake/output data recorded.  Recent Labs    02/10/18 1849 02/12/18 0415  HGB 11.2* 9.0*   Recent Labs    02/10/18 1849 02/12/18 0415  WBC 5.6 10.0  RBC 3.58* 2.83*  HCT 34.0* 27.0*  PLT 184 162   Recent Labs    02/10/18 1849 02/12/18 0415  NA  --  135  K  --  3.9  CL  --  105  CO2  --  23  BUN  --  22*  CREATININE 0.56 1.48*  GLUCOSE  --  125*  CALCIUM  --  8.4*   No results for input(s): LABPT, INR in the last 72 hours.  EXAM General - Patient is Alert, Appropriate and Oriented  Lungs - CTA bilaterally Extremity - Neurovascular intact Sensation intact distally Intact pulses distally Dorsiflexion/Plantar flexion intact No cellulitis present Compartment soft  No swelling in lower extremities Dressing - dressing C/D/I and no drainage. Hemovac and wound vac intact. 50cc drainage Motor Function - intact, moving foot and toes well on exam.   Past Medical History:  Diagnosis Date  . Anemia    hx of b12 deficiency  . Arthritis   . Headache   . Hypertension     Assessment/Plan:   2 Days Post-Op Procedure(s) (LRB): TOTAL HIP ARTHROPLASTY ANTERIOR APPROACH  (Left) Active Problems:   Status post total hip replacement, left   Acute post op blood loss anemia   Estimated body mass index is 27.44 kg/m as calculated from the following:   Height as of this encounter: 5\' 6"  (1.676 m).   Weight as of this encounter: 77.1 kg (170 lb). Advance diet Up with therapy  Needs BM Acute post op blood loss anemia - Hgb 9.0. Start Iron Acute renal insufficiency - Cr 1.48. Patient not making urine. WIll order medicine consult to evaluate. Urinalysis ordered. BP Improving. Continue to hold BP meds, patient with episodes of hypotension which could have caused pre renal insuffiencey. Recheck labs in the am CM to assist with discharge to SNF likely Friday.    DVT Prophylaxis - Lovenox, Foot Pumps and TED hose Weight-Bearing as tolerated to left leg   T. Cranston Neighborhris Kaitlynne Wenz, PA-C [facility] 02/12/2018, 8:38 AM

## 2018-02-12 NOTE — Progress Notes (Signed)
Family Meeting Note  Advance Directive:yes Today a meeting took place with the patient   The following were discussed:Patient's diagnosis: , Patient's progosis: Patient is admitted with elective left hip hemiarthroplasty.  Postop developed hypotension and acute renal failure doing well.  She had overall is improving has stable good prognosis.  CODE STATUS addressed patient wants to be full code.   Time spent during discussion: 16 mins Enedina FinnerSona Chia Mowers, MD

## 2018-02-13 ENCOUNTER — Other Ambulatory Visit: Payer: Self-pay

## 2018-02-13 LAB — CBC
HEMATOCRIT: 25.8 % — AB (ref 35.0–47.0)
Hemoglobin: 8.7 g/dL — ABNORMAL LOW (ref 12.0–16.0)
MCH: 31.9 pg (ref 26.0–34.0)
MCHC: 33.7 g/dL (ref 32.0–36.0)
MCV: 94.7 fL (ref 80.0–100.0)
PLATELETS: 173 10*3/uL (ref 150–440)
RBC: 2.72 MIL/uL — AB (ref 3.80–5.20)
RDW: 14.3 % (ref 11.5–14.5)
WBC: 8.3 10*3/uL (ref 3.6–11.0)

## 2018-02-13 LAB — BASIC METABOLIC PANEL
Anion gap: 8 (ref 5–15)
BUN: 25 mg/dL — AB (ref 6–20)
CO2: 22 mmol/L (ref 22–32)
Calcium: 8.4 mg/dL — ABNORMAL LOW (ref 8.9–10.3)
Chloride: 103 mmol/L (ref 101–111)
Creatinine, Ser: 0.9 mg/dL (ref 0.44–1.00)
GFR calc Af Amer: >60 mL/min (ref >60)
GFR calc non Af Amer: 58 mL/min — ABNORMAL LOW (ref >60)
GLUCOSE: 108 mg/dL — AB (ref 65–99)
POTASSIUM: 4.2 mmol/L (ref 3.5–5.1)
Sodium: 133 mmol/L — ABNORMAL LOW (ref 135–145)

## 2018-02-13 MED ORDER — HYDROCODONE-ACETAMINOPHEN 5-325 MG PO TABS: 1.0000 | ORAL_TABLET | ORAL | 0 refills | Status: DC | PRN

## 2018-02-13 MED ORDER — DOCUSATE SODIUM 100 MG PO CAPS: 100.0000 mg | ORAL_CAPSULE | Freq: Two times a day (BID) | ORAL | 0 refills | Status: DC

## 2018-02-13 MED ORDER — ENOXAPARIN SODIUM 40 MG/0.4ML ~~LOC~~ SOLN: 40.0000 mg | SUBCUTANEOUS | 0 refills | Status: DC

## 2018-02-13 MED ORDER — AMLODIPINE BESYLATE 10 MG PO TABS: 10.0000 mg | ORAL_TABLET | Freq: Every day | ORAL | Status: DC

## 2018-02-13 MED ORDER — BENAZEPRIL HCL 20 MG PO TABS
40.0000 mg | ORAL_TABLET | Freq: Every day | ORAL | Status: DC
  Filled 2018-02-13: qty 2

## 2018-02-13 MED ORDER — AMLODIPINE BESY-BENAZEPRIL HCL 10-40 MG PO CAPS: 1.0000 | ORAL_CAPSULE | Freq: Every day | ORAL | Status: DC

## 2018-02-13 NOTE — Progress Notes (Signed)
   Subjective: 3 Days Post-Op Procedure(s) (LRB): TOTAL HIP ARTHROPLASTY ANTERIOR APPROACH (Left) Patient reports pain as 0 on 0-10 scale.   Patient is well, and has had no acute complaints or problems. Patient able to void, blood pressure improved. Denies any CP, SOB, ABD pain. We will continue therapy today.  Plan is to go Skilled nursing facility after hospital stay.  Objective: Vital signs in last 24 hours: Temp:  [98 F (36.7 C)-99.1 F (37.3 C)] 99.1 F (37.3 C) (03/22 0410) Pulse Rate:  [76-89] 89 (03/22 0410) Resp:  [18-19] 19 (03/22 0410) BP: (112-128)/(42-51) 120/51 (03/22 0410) SpO2:  [90 %-100 %] 90 % (03/22 0410)  Intake/Output from previous day: 03/21 0701 - 03/22 0700 In: 3781.3 [P.O.:360; I.V.:3421.3] Out: 25 [Drains:25] Intake/Output this shift: No intake/output data recorded.  Recent Labs    02/10/18 1849 02/12/18 0415 02/13/18 0603  HGB 11.2* 9.0* 8.7*   Recent Labs    02/12/18 0415 02/13/18 0603  WBC 10.0 8.3  RBC 2.83* 2.72*  HCT 27.0* 25.8*  PLT 162 173   Recent Labs    02/12/18 0415 02/13/18 0603  NA 135 133*  K 3.9 4.2  CL 105 103  CO2 23 22  BUN 22* 25*  CREATININE 1.48* 0.90  GLUCOSE 125* 108*  CALCIUM 8.4* 8.4*   No results for input(s): LABPT, INR in the last 72 hours.  EXAM General - Patient is Alert, Appropriate and Oriented  Lungs - CTA bilaterally Extremity - Neurovascular intact Sensation intact distally Intact pulses distally Dorsiflexion/Plantar flexion intact No cellulitis present Compartment soft  No swelling in lower extremities Dressing - dressing C/D/I and no drainage. Hemovac and wound vac intact. 50cc drainage Motor Function - intact, moving foot and toes well on exam.   Past Medical History:  Diagnosis Date  . Anemia    hx of b12 deficiency  . Arthritis   . Headache   . Hypertension     Assessment/Plan:   3 Days Post-Op Procedure(s) (LRB): TOTAL HIP ARTHROPLASTY ANTERIOR APPROACH  (Left) Active Problems:   Status post total hip replacement, left   Acute post op blood loss anemia   Estimated body mass index is 27.44 kg/m as calculated from the following:   Height as of this encounter: 5\' 6"  (1.676 m).   Weight as of this encounter: 77.1 kg (170 lb). Advance diet Up with therapy  Acute post op blood loss anemia - Hgb 8.7 stable.   Acute renal insufficiency -resolved.  Creatinine 0.90.  Patient urinating well.  Blood pressure improved. Discharge to skilled nursing facility today Please remove provena negative pressure dressing on 02/20/2018 and apply honey comb dressing. Keep dressing clean and dry at all times. Follow-up with [facility] in 2 weeks for staple removal and Steri-Strip application.   DVT Prophylaxis - Lovenox, Foot Pumps and TED hose Weight-Bearing as tolerated to left leg   T. Cranston Neighborhris Gaines, PA-C [facility] 02/13/2018, 7:58 AM

## 2018-02-13 NOTE — Discharge Summary (Signed)
Physician Discharge Summary  Patient ID: Whitney Mcneil MRN: [id number] DOB/AGE: Dec 21, 1932 84 y.o.  Admit date: 02/10/2018 Discharge date: 02/13/2018  Admission Diagnoses:  left hip pain   Discharge Diagnoses: Patient Active Problem List   Diagnosis Date Noted  . Status post total hip replacement, left 02/10/2018  . Arthritis 06/16/2017  . Hypertension 06/16/2017  . Obesity, unspecified 06/16/2017  . Primary osteoarthritis of one knee 04/06/2015  . Murmur, cardiac 03/10/2013  . Abnormal EKG 10/16/2012    Past Medical History:  Diagnosis Date  . Anemia    hx of b12 deficiency  . Arthritis   . Headache   . Hypertension      Transfusion: non   Consultants (if any): Treatment Team:  Enedina Finner, MD  Discharged Condition: Improved  Hospital Course: Whitney Mcneil is an 84 y.o. female who was admitted 02/10/2018 with a diagnosis of <principal problem not specified> and went to the operating room on 02/10/2018 and underwent the above named procedures.    Surgeries: Procedure(s): TOTAL HIP ARTHROPLASTY ANTERIOR APPROACH on 02/10/2018 Patient tolerated the surgery well. Taken to PACU where she was stabilized and then transferred to the orthopedic floor.  Started on Lovenox 40 mg q 24 hrs. Foot pumps applied bilaterally at 80 mm. Heels elevated on bed with rolled towels. No evidence of DVT. Negative Homan. Physical therapy started on day #1 for gait training and transfer. OT started day #1 for ADL and assisted devices.  Patient's foley was d/c on day #1. Patient's IV was d/c on day #2. On post op day 2 patient was found to have elevated Cr and hypotension. BP meds were held and IV fluids given. Patient had improved Cr on post op day 4 with normal BP.   On post op day #3 patient was stable and ready for discharge to SNF.  Implants: Medacta, AMIS 3 lateralized, 54 mm Mpact DM with liner and M 28 mm metal head  Please remove provena negative pressure dressing on 02/20/2018 and  apply honey comb dressing. Keep dressing clean and dry at all times. TED HOSE BLE x 6 weeks, remove at night   She was given perioperative antibiotics:  Anti-infectives (From admission, onward)   Start     Dose/Rate Route Frequency Ordered Stop   02/10/18 1845  clindamycin (CLEOCIN) IVPB 600 mg     600 mg 100 mL/hr over 30 Minutes Intravenous Every 6 hours 02/10/18 1831 02/11/18 1545   02/10/18 1322  clindamycin (CLEOCIN) 900 MG/50ML IVPB    Note to Pharmacy:  [place] Mole: cabinet override      02/10/18 1322 02/10/18 1555   02/09/18 2200  clindamycin (CLEOCIN) IVPB 900 mg     900 mg 100 mL/hr over 30 Minutes Intravenous  Once 02/09/18 2153 02/10/18 1625    .  She was given sequential compression devices, early ambulation, and Lovenox for DVT prophylaxis.  She benefited maximally from the hospital stay and there were no complications.    Recent vital signs:  Vitals:   02/13/18 0410 02/13/18 0800  BP: (!) 120/51 (!) 123/59  Pulse: 89 90  Resp: 19 17  Temp: 99.1 F (37.3 C) 99.1 F (37.3 C)  SpO2: 90% 95%    Recent laboratory studies:  Lab Results  Component Value Date   HGB 8.7 (L) 02/13/2018   HGB 9.0 (L) 02/12/2018   HGB 11.2 (L) 02/10/2018   Lab Results  Component Value Date   WBC 8.3 02/13/2018   PLT 173 02/13/2018  Lab Results  Component Value Date   INR 0.94 02/05/2018   Lab Results  Component Value Date   NA 133 (L) 02/13/2018   K 4.2 02/13/2018   CL 103 02/13/2018   CO2 22 02/13/2018   BUN 25 (H) 02/13/2018   CREATININE 0.90 02/13/2018   GLUCOSE 108 (H) 02/13/2018    Discharge Medications:   Allergies as of 02/13/2018      Reactions   Penicillins Hives, Rash   Swelling with difficulty breathing   Celebrex [celecoxib] Rash, Other (See Comments)   At higher dose Pt only gets a rash when it is a high dose.        Medication List    STOP taking these medications   aspirin EC 81 MG tablet   celecoxib 100 MG capsule Commonly known  as:  CELEBREX     TAKE these medications   acetaminophen 325 MG tablet Commonly known as:  TYLENOL Take by mouth.   amLODipine-benazepril 10-40 MG capsule Commonly known as:  LOTREL Take by mouth.   brimonidine 0.2 % ophthalmic solution Commonly known as:  ALPHAGAN Place into the right eye 2 (two) times daily.   docusate sodium 100 MG capsule Commonly known as:  COLACE Take 1 capsule (100 mg total) by mouth 2 (two) times daily.   enoxaparin 40 MG/0.4ML injection Commonly known as:  LOVENOX Inject 0.4 mLs (40 mg total) into the skin daily for 14 days. Start taking on:  02/14/2018   HYDROcodone-acetaminophen 5-325 MG tablet Commonly known as:  NORCO/VICODIN Take 1 tablet by mouth every 4 (four) hours as needed for moderate pain (pain score 4-6).       Diagnostic Studies: Dg Hip Operative Unilat W Or W/o Pelvis Left  Result Date: 02/10/2018 CLINICAL DATA:  84 year old female for left hip replacement. Initial encounter. EXAM: OPERATIVE left HIP (WITH PELVIS IF PERFORMED) 2 VIEWS TECHNIQUE: Fluoroscopic spot image(s) were submitted for interpretation post-operatively. COMPARISON:  None. FINDINGS: Portion of the left hip prosthesis is imaged. The portions which is imaged appears in satisfactory position. Follow-up imaging recommended. IMPRESSION: Post left hip replacement incompletely assessed. Electronically Signed   By: Lacy DuverneySteven  Olson M.D.   On: 02/10/2018 17:51   Dg Hip Unilat W Or W/o Pelvis 2-3 Views Left  Result Date: 02/10/2018 CLINICAL DATA:  Left hip arthroplasty. EXAM: DG HIP (WITH OR WITHOUT PELVIS) 2-3V LEFT COMPARISON:  Intraoperative radiographs from the same day. FINDINGS: Left hip hemiarthroplasty is present. The hip is located. Skin staples are in place. Advanced degenerative changes are present at the right hip. IMPRESSION: Left hip hemiarthroplasty without radiographic evidence for complication. Electronically Signed   By: Marin Robertshristopher  Mattern M.D.   On: 02/10/2018  18:16    Disposition:      Contact information for follow-up providers    Kennedy BuckerMenz, Michael, MD Follow up in 2 day(s).   Specialty:  Orthopedic Surgery Contact information: [address] [facility] [place] [address] [phone]            Contact information for after-discharge care    Destination    [facility] .   Service:  Skilled Nursing Contact information: [address] [place] [id number] [phone]                   Signed: Patience MuscaGAINES, Winola Drum CHRISTOPHER 02/13/2018, 10:39 AM

## 2018-02-13 NOTE — Progress Notes (Signed)
Pt ready for d/c to SNF today per MD. Pt in NAD, VSS.  [facility], all questions answered. EMS transportation set up for first available time. All belongings (including purse, dentures, glasses, cell phone) packed and will be sent with pt at time of transport. PIV removed.   [place], Latricia HeftKorie G

## 2018-02-13 NOTE — Progress Notes (Signed)
Physical Therapy Treatment Patient Details Name: Whitney RidgesCornelia Mcneil MRN: [id number] DOB: 02/11/1933 Today's Date: 02/13/2018    History of Present Illness Pt admitted s/p L anterior hip replacement, ant THPs, WBAT.  PMH includes Htn, HA, arthritis and anemia.    PT Comments    Patient able to complete sit/stand and basic transfers with less physical assist this date (mod +2), but continues to require +2 for optimal safety.  Very guarded with all movement and WBing of L LE; very hesitant for L LE WBing in standing postures.  Maintains very flexed posture at trunk, hips and knees with standing; limited ability to correct (limited range available, limited by pain as well). Anticipate very slow, gradual recovery process.    Follow Up Recommendations  SNF     Equipment Recommendations       Recommendations for Other Services       Precautions / Restrictions Precautions Precautions: Anterior Hip;Fall Restrictions Weight Bearing Restrictions: Yes LLE Weight Bearing: Weight bearing as tolerated    Mobility  Bed Mobility Overal bed mobility: Needs Assistance Bed Mobility: Supine to Sit     Supine to sit: Max assist     General bed mobility comments: assist to negotiate bilat LEs, assist with hand placement and truncal elevation  Transfers Overall transfer level: Needs assistance Equipment used: Rolling walker (2 wheeled) Transfers: Sit to/from Stand Sit to Stand: Mod assist;+2 physical assistance         General transfer comment: cuing for hand placement; performance improved when patient allowed time to intiate movement herself. Does maintain triple-flexed posture, unable to acheive full extention of bilat hips or knees in upright position.  Ambulation/Gait Ambulation/Gait assistance: Mod assist;+2 physical assistance Ambulation Distance (Feet): 4 Feet Assistive device: Rolling walker (2 wheeled)       General Gait Details: broad BOS, forward flexed posture; very  short, shuffling steps.  Poor balance; poor tolerance for WBing L LE. Unsafe to attempt without +2 at all times.   Stairs            Wheelchair Mobility    Modified Rankin (Stroke Patients Only)       Balance Overall balance assessment: Needs assistance Sitting-balance support: No upper extremity supported;Feet supported Sitting balance-Leahy Scale: Fair   Postural control: Posterior lean Standing balance support: Bilateral upper extremity supported Standing balance-Leahy Scale: Zero                              Cognition Arousal/Alertness: Awake/alert Behavior During Therapy: WFL for tasks assessed/performed Overall Cognitive Status: Within Functional Limits for tasks assessed                                        Exercises Other Exercises Other Exercises: Supine LE therex, 1x10, to L LE, act assist ROM: ankle pumps, quad sets, heel slides, hip abduct/adduct.  Very guarded with ROM, tolerating only approx 25% normal range; limited by pain and difficulty with muscle termination (remains generally guarded and tense) Other Exercises: Sit/stand x2 with RW, mod assist +2-assist for lift off, anterior weight translation and standing balance; performance imrpoved when patient allowed to initiate movement indep    General Comments        Pertinent Vitals/Pain Pain Assessment: Faces Faces Pain Scale: Hurts even more Pain Location: L hip Pain Descriptors / Indicators: Aching;Grimacing;Guarding Pain Intervention(s): Limited activity within  patient's tolerance;Monitored during session;Repositioned    Home Living                      Prior Function            PT Goals (current goals can now be found in the care plan section) Acute Rehab PT Goals Patient Stated Goal: To go to SNF and regain prior mobility. PT Goal Formulation: With patient Time For Goal Achievement: 02/26/18 Potential to Achieve Goals: Fair Progress towards PT  goals: Progressing toward goals    Frequency    BID      PT Plan Current plan remains appropriate    Co-evaluation              AM-PAC PT "6 Clicks" Daily Activity  Outcome Measure  Difficulty turning over in bed (including adjusting bedclothes, sheets and blankets)?: Unable Difficulty moving from lying on back to sitting on the side of the bed? : Unable Difficulty sitting down on and standing up from a chair with arms (e.g., wheelchair, bedside commode, etc,.)?: Unable Help needed moving to and from a bed to chair (including a wheelchair)?: A Lot Help needed walking in hospital room?: Total Help needed climbing 3-5 steps with a railing? : Total 6 Click Score: 7    End of Session Equipment Utilized During Treatment: Gait belt Activity Tolerance: Patient limited by fatigue;Patient limited by pain Patient left: in chair;with chair alarm set;with call bell/phone within reach Nurse Communication: Mobility status PT Visit Diagnosis: Unsteadiness on feet (R26.81);Muscle weakness (generalized) (M62.81);History of falling (Z91.81);Pain Pain - Right/Left: Left Pain - part of body: Hip     Time: 4098-1191 PT Time Calculation (min) (ACUTE ONLY): 19 min  Charges:  $Therapeutic Activity: 8-22 mins                    G Codes:       Tyon Cerasoli H. Manson Passey, PT, DPT, NCS 02/13/18, 10:38 AM [phone]

## 2018-02-13 NOTE — Progress Notes (Signed)
Patient is medically stable for D[facility]. Per [facility] admissions coordinator [facility] patient can come today to room 206-B. RN will call report at [phone] and arrange EMS for transport. Clinical Child psychotherapistocial Worker (CSW) sent [facility]. Patient is aware of above. CSW contacted patient's daughter [name] and made her aware of above. Please reconsult if future social work needs arise. CSW signing off.   [employer] Hughes IncorporatedBailey Nikeisha Klutz, LCSW [phone]

## 2018-02-13 NOTE — Progress Notes (Signed)
[facility] [place] [facility]   PATIENT NAME: Whitney RidgesCornelia Mcneil    MR#:  [id number]  DATE OF BIRTH:  03/06/1933  SUBJECTIVE:   Doing well REVIEW OF SYSTEMS:   Review of Systems  Constitutional: Negative for chills, fever and weight loss.  HENT: Negative for ear discharge, ear pain and nosebleeds.   Eyes: Negative for blurred vision, pain and discharge.  Respiratory: Negative for sputum production, shortness of breath, wheezing and stridor.   Cardiovascular: Negative for chest pain, palpitations, orthopnea and PND.  Gastrointestinal: Negative for abdominal pain, diarrhea, nausea and vomiting.  Genitourinary: Negative for frequency and urgency.  Musculoskeletal: Negative for back pain and joint pain.  Neurological: Negative for sensory change, speech change, focal weakness and weakness.  Psychiatric/Behavioral: Negative for depression and hallucinations. The patient is not nervous/anxious.    Tolerating Diet:yes Tolerating PT: yes  DRUG ALLERGIES:   Allergies  Allergen Reactions  . Penicillins Hives and Rash    Swelling with difficulty breathing  . Celebrex [Celecoxib] Rash and Other (See Comments)    At higher dose Pt only gets a rash when it is a high dose.      VITALS:  Blood pressure (!) 120/51, pulse 89, temperature 99.1 F (37.3 C), temperature source Oral, resp. rate 19, height 5\' 6"  (1.676 m), weight 77.1 kg (170 lb), SpO2 90 %.  PHYSICAL EXAMINATION:   Physical Exam  GENERAL:  84 y.o.-year-old patient lying in the bed with no acute distress.  EYES: Pupils equal, round, reactive to light and accommodation. No scleral icterus. Extraocular muscles intact.  HEENT: Head atraumatic, normocephalic. Oropharynx and nasopharynx clear.  NECK:  Supple, no jugular venous distention. No thyroid enlargement, no tenderness.  LUNGS: Normal breath sounds bilaterally, no wheezing, rales, rhonchi. No use of accessory muscles of respiration.   CARDIOVASCULAR: S1, S2 normal. No murmurs, rubs, or gallops.  ABDOMEN: Soft, nontender, nondistended. Bowel sounds present. No organomegaly or mass.  EXTREMITIES: No cyanosis, clubbing or edema b/l.    NEUROLOGIC: Cranial nerves II through XII are intact. No focal Motor or sensory deficits b/l.   PSYCHIATRIC:  patient is alert and oriented x 3.  SKIN: No obvious rash, lesion, or ulcer.   LABORATORY PANEL:  CBC Recent Labs  Lab 02/13/18 0603  WBC 8.3  HGB 8.7*  HCT 25.8*  PLT 173    Chemistries  Recent Labs  Lab 02/13/18 0603  NA 133*  K 4.2  CL 103  CO2 22  GLUCOSE 108*  BUN 25*  CREATININE 0.90  CALCIUM 8.4*   Cardiac Enzymes No results for input(s): TROPONINI in the last 168 hours. RADIOLOGY:  No results found. ASSESSMENT AND PLAN:  Whitney Mcneil  is a 84 y.o. female with a known history of hypertension, arthritis is status post Day 2 elective total left hemiarthroplasty.  Internal medicine was consulted since patient's creatinine is rising and hypotension.  1.  Hypotension--postop in the setting of anesthesia and oral home meds -BP much improved than yesterday -Continue to monitor.  Patient is asymptomatic -resume bp meds at d/c  2.  Acute renal failure in the setting of diuretic, hypotension, postop and anesthesia -Monitor I's and O's, avoid nephrotoxin -Creatinine baseline is 0.56 -Creatinine  1.48--IVF--creat now 0.9 -Bladder scan and PRN in and out if needed -Patient encouraged to drink fluids  3.  Postop day 3 left hemiarthroplasty secondary to DJD -Continue physical therapy  4.  DVT prophylaxis subcu Lovenox  Medically stable Will sigh off. Thanks!!  D/w ortho  Case discussed with Care Management/Social Worker. Management plans discussed with the patient, family and they are in agreement.  CODE STATUS: Full  DVT Prophylaxis: lovenox  TOTAL TIME TAKING CARE OF THIS PATIENT:30 minutes.  >50% time spent on counselling and coordination  of care   Note: This dictation was prepared with Dragon dictation along with smaller phrase technology. Any transcriptional errors that result from this process are unintentional.  Enedina Finner M.D on 02/13/2018 at 8:01 AM  Between 7am to 6pm - Pager - [phone]  After 6pm go to [email or web address] - password [email or web address]  [facility]  Office  [phone]  CC: Primary care physician; Rafael Bihari, MDPatient ID: Whitney Mcneil, female   DOB: [birth year], 84 y.o.   MRN: [id number]

## 2018-02-13 NOTE — Care Management Important Message (Signed)
Important Message  Patient Details  Name: Whitney RidgesCornelia Ferdig MRN: [id number] Date of Birth: 03/05/1933   Medicare Important Message Given:  Yes    Olegario MessierKathy A Melina Mosteller 02/13/2018, 1:31 PM

## 2018-02-13 NOTE — Discharge Instructions (Signed)
°ANTERIOR APPROACH TOTAL HIP REPLACEMENT POSTOPERATIVE DIRECTIONS ° ° °Hip Rehabilitation, Guidelines Following Surgery  °The results of a hip operation are greatly improved after range of motion and muscle strengthening exercises. Follow all safety measures which are given to protect your hip. If any of these exercises cause increased pain or swelling in your joint, decrease the amount until you are comfortable again. Then slowly increase the exercises. Call your caregiver if you have problems or questions.  ° °HOME CARE INSTRUCTIONS  °Remove items at home which could result in a fall. This includes throw rugs or furniture in walking pathways.  °· ICE to the affected hip every three hours for 30 minutes at a time and then as needed for pain and swelling.  Continue to use ice on the hip for pain and swelling from surgery. You may notice swelling that will progress down to the foot and ankle.  This is normal after surgery.  Elevate the leg when you are not up walking on it.   °· Continue to use the breathing machine which will help keep your temperature down.  It is common for your temperature to cycle up and down following surgery, especially at night when you are not up moving around and exerting yourself.  The breathing machine keeps your lungs expanded and your temperature down. °· Do not place pillow under knee, focus on keeping the knee straight while resting ° °DIET °You may resume your previous home diet once your are discharged from the hospital. ° °DRESSING / WOUND CARE / SHOWERING °Please remove provena negative pressure dressing on 02/20/2018 and apply honey comb dressing. Keep dressing clean and dry at all times. ° °ACTIVITY °Walk with your walker as instructed. °Use walker as long as suggested by your caregivers. °Avoid periods of inactivity such as sitting longer than an hour when not asleep. This helps prevent blood clots.  °You may resume a sexual relationship in one month or when given the OK by  your doctor.  °You may return to work once you are cleared by your doctor.  °Do not drive a car for 6 weeks or until released by you surgeon.  °Do not drive while taking narcotics. ° °WEIGHT BEARING °Weight bearing as tolerated. Use walker/cane as needed for at least 4 weeks post op. ° °POSTOPERATIVE CONSTIPATION PROTOCOL °Constipation - defined medically as fewer than three stools per week and severe constipation as less than one stool per week. ° °One of the most common issues patients have following surgery is constipation.  Even if you have a regular bowel pattern at home, your normal regimen is likely to be disrupted due to multiple reasons following surgery.  Combination of anesthesia, postoperative narcotics, change in appetite and fluid intake all can affect your bowels.  In order to avoid complications following surgery, here are some recommendations in order to help you during your recovery period. ° °Colace (docusate) - Pick up an over-the-counter form of Colace or another stool softener and take twice a day as long as you are requiring postoperative pain medications.  Take with a full glass of water daily.  If you experience loose stools or diarrhea, hold the colace until you stool forms back up.  If your symptoms do not get better within 1 week or if they get worse, check with your doctor. ° °Dulcolax (bisacodyl) - Pick up over-the-counter and take as directed by the product packaging as needed to assist with the movement of your bowels.  Take with a full   glass of water.  Use this product as needed if not relieved by Colace only.  ° °MiraLax (polyethylene glycol) - Pick up over-the-counter to have on hand.  MiraLax is a solution that will increase the amount of water in your bowels to assist with bowel movements.  Take as directed and can mix with a glass of water, juice, soda, coffee, or tea.  Take if you go more than two days without a movement. °Do not use MiraLax more than once per day. Call your  doctor if you are still constipated or irregular after using this medication for 7 days in a row. ° °If you continue to have problems with postoperative constipation, please contact the office for further assistance and recommendations.  If you experience "the worst abdominal pain ever" or develop nausea or vomiting, please contact the office immediatly for further recommendations for treatment. ° °ITCHING ° If you experience itching with your medications, try taking only a single pain pill, or even half a pain pill at a time.  You can also use Benadryl over the counter for itching or also to help with sleep.  ° °TED HOSE STOCKINGS °Wear the elastic stockings on both legs for six weeks following surgery during the day but you may remove then at night for sleeping. ° °MEDICATIONS °See your medication summary on the “After Visit Summary” that the nursing staff will review with you prior to discharge.  You may have some home medications which will be placed on hold until you complete the course of blood thinner medication.  It is important for you to complete the blood thinner medication as prescribed by your surgeon.  Continue your approved medications as instructed at time of discharge. ° °PRECAUTIONS °If you experience chest pain or shortness of breath - call 911 immediately for transfer to the hospital emergency department.  °If you develop a fever greater that 101 F, purulent drainage from wound, increased redness or drainage from wound, foul odor from the wound/dressing, or calf pain - CONTACT YOUR SURGEON.   °                                                °FOLLOW-UP APPOINTMENTS °Make sure you keep all of your appointments after your operation with your surgeon and caregivers. You should call the office at the above phone number and make an appointment for approximately two weeks after the date of your surgery or on the date instructed by your surgeon outlined in the "After Visit Summary". ° °RANGE OF MOTION  AND STRENGTHENING EXERCISES  °These exercises are designed to help you keep full movement of your hip joint. Follow your caregiver's or physical therapist's instructions. Perform all exercises about fifteen times, three times per day or as directed. Exercise both hips, even if you have had only one joint replacement. These exercises can be done on a training (exercise) mat, on the floor, on a table or on a bed. Use whatever works the best and is most comfortable for you. Use music or television while you are exercising so that the exercises are a pleasant break in your day. This will make your life better with the exercises acting as a break in routine you can look forward to.  °Lying on your back, slowly slide your foot toward your buttocks, raising your knee up off the floor. Then slowly   slide your foot back down until your leg is straight again.  °Lying on your back spread your legs as far apart as you can without causing discomfort.  °Lying on your side, raise your upper leg and foot straight up from the floor as far as is comfortable. Slowly lower the leg and repeat.  °Lying on your back, tighten up the muscle in the front of your thigh (quadriceps muscles). You can do this by keeping your leg straight and trying to raise your heel off the floor. This helps strengthen the largest muscle supporting your knee.  °Lying on your back, tighten up the muscles of your buttocks both with the legs straight and with the knee bent at a comfortable angle while keeping your heel on the floor.  ° °IF YOU ARE TRANSFERRED TO A SKILLED REHAB FACILITY °If the patient is transferred to a skilled rehab facility following release from the hospital, a list of the current medications will be sent to the facility for the patient to continue.  When discharged from the skilled rehab facility, please have the facility set up the patient's Home Health Physical Therapy prior to being released. Also, the skilled facility will be responsible  for providing the patient with their medications at time of release from the facility to include their pain medication, the muscle relaxants, and their blood thinner medication. If the patient is still at the rehab facility at time of the two week follow up appointment, the skilled rehab facility will also need to assist the patient in arranging follow up appointment in our office and any transportation needs. ° °MAKE SURE YOU:  °Understand these instructions.  °Get help right away if you are not doing well or get worse.  ° ° °Pick up stool softner and laxative for home use following surgery while on pain medications. °Continue to use ice for pain and swelling after surgery. °Do not use any lotions or creams on the incision until instructed by your surgeon. ° °

## 2018-02-13 NOTE — Telephone Encounter (Signed)
Rx sent to [facility] phone : [phone] , fax : [phone]  

## 2018-02-13 NOTE — Clinical Social Work Placement (Signed)
   CLINICAL SOCIAL WORK PLACEMENT  NOTE  Date:  02/13/2018  Patient Details  Name: Whitney Mcneil MRN: [id number] Date of Birth: 02/20/1933  Clinical Social Work is seeking post-discharge placement for this patient at the Skilled  Nursing Facility level of care (*CSW will initial, date and re-position this form in  chart as items are completed):  Yes   Patient/family provided with [facility] Clinical Social Work Department's list of facilities offering this level of care within the geographic area requested by the patient (or if unable, by the patient's family).  Yes   Patient/family informed of their freedom to choose among providers that offer the needed level of care, that participate in Medicare, Medicaid or managed care program needed by the patient, have an available bed and are willing to accept the patient.  Yes   Patient/family informed of 's ownership interest in [facility] and [facility], as well as of the fact that they are under no obligation to receive care at these facilities.  PASRR submitted to EDS on       PASRR number received on       Existing PASRR number confirmed on 02/11/18     FL2 transmitted to all facilities in geographic area requested by pt/family on 02/11/18     FL2 transmitted to all facilities within larger geographic area on       Patient informed that his/her managed care company has contracts with or will negotiate with certain facilities, including the following:        Yes   Patient/family informed of bed offers received.  Patient chooses bed at [facility] )     Physician recommends and patient chooses bed at      Patient to be transferred to [facility] ) on 02/13/18.  Patient to be transferred to facility by [facility] )     Patient family notified on 02/13/18 of transfer.  Name of family member notified:  (Patient's daughter [name] is aware of D/C today. )     PHYSICIAN       Additional  Comment:    _______________________________________________ Tniya Bowditch, Darleen CrockerBailey M, LCSW 02/13/2018, 11:02 AM

## 2018-02-16 DIAGNOSIS — M1612 Unilateral primary osteoarthritis, left hip: Secondary | ICD-10-CM | POA: Insufficient documentation

## 2018-02-23 ENCOUNTER — Encounter
Admission: RE | Admit: 2018-02-23 | Discharge: 2018-02-23 | Disposition: A | Discharge status: Home / Self Care | Payer: Medicare Other | Source: Ambulatory Visit | Attending: Internal Medicine | Admitting: Internal Medicine

## 2018-02-26 ENCOUNTER — Non-Acute Institutional Stay (SKILLED_NURSING_FACILITY): Payer: Medicare Other | Admitting: Gerontology

## 2018-02-26 DIAGNOSIS — M1990 Unspecified osteoarthritis, unspecified site: Secondary | ICD-10-CM | POA: Diagnosis not present

## 2018-02-26 DIAGNOSIS — Z96642 Presence of left artificial hip joint: Secondary | ICD-10-CM

## 2018-02-26 NOTE — Progress Notes (Signed)
Location:      Place of Service:  SNF (31) Provider:  Toni Arthurs, NP-C  Madelyn Brunner, MD  Patient Care Team: Madelyn Brunner, MD as PCP - General (Internal Medicine)  Extended Emergency Contact Information Primary Emergency Contact: Egger,Benjamin Address: [address] [place]lin, [place] Phone: [phone] Relation: Son Secondary Emergency Contact: Chad Cordial Address: [address]          [place], [place] [address] [place] of [place] Phone: [phone] Relation: Sister  Code Status:  FULL Goals of care: Advanced Directive information Advanced Directives 02/10/2018  Does Patient Have a Medical Advance Directive? No  Would patient like information on creating a medical advance directive? No - Patient declined     Chief Complaint  Patient presents with  . Medical Management of Chronic Issues    HPI:  Pt is a 84 y.o. female seen today for medical management of chronic diseases.  Pt was admitted to the facility for rehab following hospitalization at [facility] for Left Total Hip replacement. Pt has been participating in PT/OT. Pt has been progressing well. Pt reports her pain is well controlled on current regimen. Incision is well approximated. No redness, no drainage. Calves soft, supple. Negative homan's sign. Pt reports her appetite is good, voiding well and having regular BMs. Pt is scheduled to f/u with Orthopedist tomorrow. VSS. No other complaints.      Past Medical History:  Diagnosis Date  . Anemia    hx of b12 deficiency  . Arthritis   . Headache   . Hypertension    Past Surgical History:  Procedure Laterality Date  . ABDOMINAL HYSTERECTOMY    . EYE SURGERY Left    cataract extraction  . TOTAL HIP ARTHROPLASTY Left 02/10/2018   Procedure: TOTAL HIP ARTHROPLASTY ANTERIOR APPROACH;  Surgeon: Hessie Knows, MD;  Location: [facility] ORS;  Service: Orthopedics;  Laterality: Left;  . TOTAL KNEE ARTHROPLASTY Right  04/06/2015   Procedure: TOTAL KNEE ARTHROPLASTY;  Surgeon: Hessie Knows, MD;  Location: [facility] ORS;  Service: Orthopedics;  Laterality: Right;    Allergies  Allergen Reactions  . Penicillins Hives and Rash    Swelling with difficulty breathing  . Celebrex [Celecoxib] Rash and Other (See Comments)    At higher dose Pt only gets a rash when it is a high dose.      Allergies as of 02/26/2018      Reactions   Penicillins Hives, Rash   Swelling with difficulty breathing   Celebrex [celecoxib] Rash, Other (See Comments)   At higher dose Pt only gets a rash when it is a high dose.        Medication List        Accurate as of 02/26/18 11:44 PM. Always use your most recent med list.          acetaminophen 325 MG tablet Commonly known as:  TYLENOL Take by mouth.   amLODipine-benazepril 10-40 MG capsule Commonly known as:  LOTREL Take by mouth.   brimonidine 0.2 % ophthalmic solution Commonly known as:  ALPHAGAN Place into the right eye 2 (two) times daily.   docusate sodium 100 MG capsule Commonly known as:  COLACE Take 1 capsule (100 mg total) by mouth 2 (two) times daily.   enoxaparin 40 MG/0.4ML injection Commonly known as:  LOVENOX Inject 0.4 mLs (40 mg total) into the skin daily for 14 days.   HYDROcodone-acetaminophen 5-325 MG  tablet Commonly known as:  NORCO/VICODIN Take 1 tablet by mouth every 4 (four) hours as needed for moderate pain (pain score 4-6).       Review of Systems  Constitutional: Negative for activity change, appetite change, chills, diaphoresis and fever.  HENT: Negative for congestion, mouth sores, nosebleeds, postnasal drip, sneezing, sore throat, trouble swallowing and voice change.   Respiratory: Negative for apnea, cough, choking, chest tightness, shortness of breath and wheezing.   Cardiovascular: Negative for chest pain, palpitations and leg swelling.  Gastrointestinal: Negative for abdominal distention, abdominal pain, constipation, diarrhea  and nausea.  Genitourinary: Negative for difficulty urinating, dysuria, frequency and urgency.  Musculoskeletal: Positive for arthralgias (typical arthritis). Negative for back pain, gait problem and myalgias.  Skin: Positive for wound. Negative for color change, pallor and rash.  Neurological: Negative for dizziness, tremors, syncope, speech difficulty, weakness, numbness and headaches.  Psychiatric/Behavioral: Negative for agitation and behavioral problems.  All other systems reviewed and are negative.    There is no immunization history on file for this patient. Pertinent  Health Maintenance Due  Topic Date Due  . DEXA SCAN  04/30/2000  . PNA vac Low Risk Adult (1 of 2 - PCV13) 04/30/2000  . INFLUENZA VACCINE  06/25/2018   No flowsheet data found. Functional Status Survey:    Vitals:   02/25/18 2100  BP: (!) 123/48  Pulse: 68  Resp: 20  Temp: 98.2 F (36.8 C)  SpO2: 97%   There is no height or weight on file to calculate BMI. Physical Exam  Constitutional: She is oriented to person, place, and time. Vital signs are normal. She appears well-developed and well-nourished. She is active and cooperative. She does not appear ill. No distress.  HENT:  Head: Normocephalic and atraumatic.  Mouth/Throat: Uvula is midline, oropharynx is clear and moist and mucous membranes are normal. Mucous membranes are not pale, not dry and not cyanotic.  Eyes: Pupils are equal, round, and reactive to light. Conjunctivae, EOM and lids are normal.  Neck: Trachea normal, normal range of motion and full passive range of motion without pain. Neck supple. No JVD present. No tracheal deviation, no edema and no erythema present. No thyromegaly present.  Cardiovascular: Normal rate, regular rhythm, normal heart sounds, intact distal pulses and normal pulses. Exam reveals no gallop, no distant heart sounds and no friction rub.  No murmur heard. Pulses:      Dorsalis pedis pulses are 2+ on the right side,  and 2+ on the left side.  No edema  Pulmonary/Chest: Effort normal and breath sounds normal. No accessory muscle usage. No respiratory distress. She has no decreased breath sounds. She has no wheezes. She has no rhonchi. She has no rales. She exhibits no tenderness.  Abdominal: Soft. Normal appearance and bowel sounds are normal. She exhibits no distension and no ascites. There is no tenderness.  Musculoskeletal: She exhibits no edema or tenderness.       Left hip: She exhibits decreased range of motion, decreased strength and laceration.  Expected osteoarthritis, stiffness; Bilateral Calves soft, supple. Negative Homan's Sign. B- pedal pulses equal  Neurological: She is alert and oriented to person, place, and time. She has normal strength. Coordination abnormal.  Skin: Skin is warm and dry. Laceration (left hip incision) noted. She is not diaphoretic. No cyanosis. No pallor. Nails show no clubbing.  Psychiatric: She has a normal mood and affect. Her speech is normal and behavior is normal. Judgment and thought content normal. Cognition and memory are  normal.  Nursing note and vitals reviewed.   Labs reviewed: Recent Labs    02/05/18 1617 02/10/18 1849 02/12/18 0415 02/13/18 0603  NA 141  --  135 133*  K 4.0  --  3.9 4.2  CL 106  --  105 103  CO2 26  --  23 22  GLUCOSE 106*  --  125* 108*  BUN 29*  --  22* 25*  CREATININE 0.72 0.56 1.48* 0.90  CALCIUM 9.8  --  8.4* 8.4*   No results for input(s): AST, ALT, ALKPHOS, BILITOT, PROT, ALBUMIN in the last 8760 hours. Recent Labs    02/05/18 1617 02/10/18 1849 02/12/18 0415 02/13/18 0603  WBC 5.4 5.6 10.0 8.3  NEUTROABS 3.1  --   --   --   HGB 12.2 11.2* 9.0* 8.7*  HCT 36.9 34.0* 27.0* 25.8*  MCV 95.4 95.0 95.4 94.7  PLT 210 184 162 173   No results found for: TSH No results found for: HGBA1C No results found for: CHOL, HDL, LDLCALC, LDLDIRECT, TRIG, CHOLHDL  Significant Diagnostic Results in last 30 days:  Dg Hip Operative  Unilat W Or W/o Pelvis Left  Result Date: 02/10/2018 CLINICAL DATA:  84 year old female for left hip replacement. Initial encounter. EXAM: OPERATIVE left HIP (WITH PELVIS IF PERFORMED) 2 VIEWS TECHNIQUE: Fluoroscopic spot image(s) were submitted for interpretation post-operatively. COMPARISON:  None. FINDINGS: Portion of the left hip prosthesis is imaged. The portions which is imaged appears in satisfactory position. Follow-up imaging recommended. IMPRESSION: Post left hip replacement incompletely assessed. Electronically Signed   By: Genia Del M.D.   On: 02/10/2018 17:51   Dg Hip Unilat W Or W/o Pelvis 2-3 Views Left  Result Date: 02/10/2018 CLINICAL DATA:  Left hip arthroplasty. EXAM: DG HIP (WITH OR WITHOUT PELVIS) 2-3V LEFT COMPARISON:  Intraoperative radiographs from the same day. FINDINGS: Left hip hemiarthroplasty is present. The hip is located. Skin staples are in place. Advanced degenerative changes are present at the right hip. IMPRESSION: Left hip hemiarthroplasty without radiographic evidence for complication. Electronically Signed   By: San Morelle M.D.   On: 02/10/2018 18:16    Assessment/Plan Nylah was seen today for medical management of chronic issues.  Diagnoses and all orders for this visit:  Arthritis  Status post total hip replacement, left   Continue PT/OT  Continue exercises as taught by PT/OT  Continue ice pack to the hip prn for pain or edema  Continue Lovenox 40 mg SQ Q Day  Continue Norco 5/325 mg po Q 4 hours prn pain  Continue Colace 100 mg po BID for constipation  Skin care per protocol  Follow up with Orthopedist as instructed  Family/ staff Communication:   Total Time:  Documentation:  Face to Face:  Family/Phone:   Labs/tests ordered:  Cbc, met c on monday  Medication list reviewed and assessed for continued appropriateness. Monthly medication orders reviewed and signed.  Vikki Ports, NP-C Geriatrics [facility] [phone], [place] [address] Cell Phone (Mon-Fri 8am-5pm):  [phone] On Call:  [phone] & follow prompts after 5pm & weekends Office Phone:  [phone] Office Fax:  [phone]

## 2018-03-02 ENCOUNTER — Other Ambulatory Visit
Admission: RE | Admit: 2018-03-02 | Discharge: 2018-03-02 | Disposition: A | Discharge status: Home / Self Care | Payer: Medicare Other | Source: Ambulatory Visit | Attending: Gerontology | Admitting: Gerontology

## 2018-03-02 DIAGNOSIS — I10 Essential (primary) hypertension: Secondary | ICD-10-CM | POA: Insufficient documentation

## 2018-03-02 LAB — COMPREHENSIVE METABOLIC PANEL
ALK PHOS: 522 U/L — AB (ref 38–126)
ALT: 324 U/L — AB (ref 14–54)
AST: 309 U/L — AB (ref 15–41)
Albumin: 2.9 g/dL — ABNORMAL LOW (ref 3.5–5.0)
Anion gap: 8 (ref 5–15)
BILIRUBIN TOTAL: 1.4 mg/dL — AB (ref 0.3–1.2)
BUN: 11 mg/dL (ref 6–20)
CALCIUM: 9 mg/dL (ref 8.9–10.3)
CO2: 24 mmol/L (ref 22–32)
Chloride: 109 mmol/L (ref 101–111)
Creatinine, Ser: 0.48 mg/dL (ref 0.44–1.00)
GFR calc Af Amer: >60 mL/min (ref >60)
GFR calc non Af Amer: >60 mL/min (ref >60)
GLUCOSE: 82 mg/dL (ref 65–99)
Potassium: 4.2 mmol/L (ref 3.5–5.1)
Sodium: 141 mmol/L (ref 135–145)
TOTAL PROTEIN: 6.5 g/dL (ref 6.5–8.1)

## 2018-03-02 LAB — CBC WITH DIFFERENTIAL/PLATELET
BASOS ABS: 0 10*3/uL (ref 0–0.1)
Basophils Relative: 1 %
Eosinophils Absolute: 0.1 10*3/uL (ref 0–0.7)
Eosinophils Relative: 3 %
HEMATOCRIT: 27.6 % — AB (ref 35.0–47.0)
HEMOGLOBIN: 9 g/dL — AB (ref 12.0–16.0)
Lymphocytes Relative: 30 %
Lymphs Abs: 1.1 10*3/uL (ref 1.0–3.6)
MCH: 30.7 pg (ref 26.0–34.0)
MCHC: 32.5 g/dL (ref 32.0–36.0)
MCV: 94.5 fL (ref 80.0–100.0)
MONOS PCT: 8 %
Monocytes Absolute: 0.3 10*3/uL (ref 0.2–0.9)
NEUTROS ABS: 2.2 10*3/uL (ref 1.4–6.5)
NEUTROS PCT: 58 %
Platelets: 438 10*3/uL (ref 150–440)
RBC: 2.92 MIL/uL — AB (ref 3.80–5.20)
RDW: 14.9 % — ABNORMAL HIGH (ref 11.5–14.5)
WBC: 3.7 10*3/uL (ref 3.6–11.0)

## 2018-03-04 ENCOUNTER — Encounter: Payer: Self-pay | Admitting: Gerontology

## 2018-03-04 ENCOUNTER — Non-Acute Institutional Stay (SKILLED_NURSING_FACILITY): Payer: Medicare Other | Admitting: Gerontology

## 2018-03-04 DIAGNOSIS — E46 Unspecified protein-calorie malnutrition: Secondary | ICD-10-CM | POA: Insufficient documentation

## 2018-03-04 DIAGNOSIS — Z96642 Presence of left artificial hip joint: Secondary | ICD-10-CM

## 2018-03-04 DIAGNOSIS — M1990 Unspecified osteoarthritis, unspecified site: Secondary | ICD-10-CM

## 2018-03-04 NOTE — Progress Notes (Signed)
Location:   [facility] Room Number: 207A Place of Service:  SNF [phone]  Provider: Lorenso QuarryShannon Climmie Cronce, NP-C  PCP: Rafael BihariWalker, John B III, MD Patient Care Team: Rafael BihariWalker, John B III, MD as PCP - General (Internal Medicine)  Extended Emergency Contact Information Primary Emergency Contact: Seybold,Benjamin Address[address]          [place], [place] Home Phone: [phone] Relation: Son Secondary Emergency Contact: Damaris SchoonerStanfield,[name] Address: [address]          [place], [place] [id number] [place] Home Phone: [phone] Relation: Sister  Code Status: FULL Goals of care:  Advanced Directive information Advanced Directives 02/10/2018  Does Patient Have a Medical Advance Directive? No  Would patient like information on creating a medical advance directive? No - Patient declined     Allergies  Allergen Reactions  . Penicillins Hives and Rash    Swelling with difficulty breathing  . Celebrex [Celecoxib] Rash and Other (See Comments)    At higher dose Pt only gets a rash when it is a high dose.      Chief Complaint  Patient presents with  . Discharge Note    Discharged from SNF    HPI:  84 y.o. female was seen today for discharge evaluation. Pt was admitted to the facility for rehab following hospitalization for Left Total Hip Replacement. Pt has been working with PT/OT. She has been progressing well. Pt reports her pain is well controlled on current regimen. Pt reports her appetite is good, voiding well and having regular BMs. Incision is CDI, well approximated. Calves soft, supple. Negative Homan;s sign. Pt reports she is feeling well and is ready to discharge home in a few days. She is set to discharge to her [place]. Flying out Saturday. Daughter will arrange for all DME, therapies, etc in [place]. VSS. No other complaints.     Past Medical History:  Diagnosis Date  . Acute nasopharyngitis (common cold)   .  Allergic rhinitis    cause unspecified  . Anemia    hx of b12 deficiency  . Arthritis   . Arthropathy, unspecified   . Cataracts, bilateral   . Headache   . Hypertension   . Obesity     Past Surgical History:  Procedure Laterality Date  . ABDOMINAL HYSTERECTOMY  1970  . CATARACT EXTRACTION Left 01/11/2015  . CATARACT EXTRACTION Right   . EYE SURGERY Left    cataract extraction  . TOTAL HIP ARTHROPLASTY Left 02/10/2018   Procedure: TOTAL HIP ARTHROPLASTY ANTERIOR APPROACH;  Surgeon: Kennedy BuckerMenz, Michael, MD;  Location: [facility] ORS;  Service: Orthopedics;  Laterality: Left;  . TOTAL KNEE ARTHROPLASTY Right 04/06/2015   Procedure: TOTAL KNEE ARTHROPLASTY;  Surgeon: Kennedy BuckerMichael Menz, MD;  Location: [facility] ORS;  Service: Orthopedics;  Laterality: Right;      reports that she has never smoked. She has never used smokeless tobacco. She reports that she does not drink alcohol or use drugs. Social History   Socioeconomic History  . Marital status: Widowed    Spouse name: Not on file  . Number of children: 3  . Years of education: Not on file  . Highest education level: Not on file  Occupational History  . Not on file  Social Needs  . Financial resource strain: Not on file  . Food insecurity:    Worry: Not on file    Inability: Not on file  . Transportation needs:  Medical: Not on file    Non-medical: Not on file  Tobacco Use  . Smoking status: Never Smoker  . Smokeless tobacco: Never Used  Substance and Sexual Activity  . Alcohol use: No  . Drug use: No  . Sexual activity: Not Currently  Lifestyle  . Physical activity:    Days per week: Not on file    Minutes per session: Not on file  . Stress: Not on file  Relationships  . Social connections:    Talks on phone: Not on file    Gets together: Not on file    Attends religious service: Not on file    Active member of club or organization: Not on file    Attends meetings of clubs or organizations: Not on file    Relationship status:  Not on file  . Intimate partner violence:    Fear of current or ex partner: Not on file    Emotionally abused: Not on file    Physically abused: Not on file    Forced sexual activity: Not on file  Other Topics Concern  . Not on file  Social History Narrative  . Not on file   Functional Status Survey:    Allergies  Allergen Reactions  . Penicillins Hives and Rash    Swelling with difficulty breathing  . Celebrex [Celecoxib] Rash and Other (See Comments)    At higher dose Pt only gets a rash when it is a high dose.      Pertinent  Health Maintenance Due  Topic Date Due  . DEXA SCAN  04/30/2000  . PNA vac Low Risk Adult (1 of 2 - PCV13) 04/30/2000  . INFLUENZA VACCINE  06/25/2018    Medications: Allergies as of 03/04/2018      Reactions   Penicillins Hives, Rash   Swelling with difficulty breathing   Celebrex [celecoxib] Rash, Other (See Comments)   At higher dose Pt only gets a rash when it is a high dose.        Medication List        Accurate as of 03/04/18 11:36 AM. Always use your most recent med list.          acetaminophen 325 MG tablet Commonly known as:  TYLENOL Take 325 mg by mouth daily.   amLODipine-benazepril 10-40 MG capsule Commonly known as:  LOTREL Take 1 capsule by mouth daily.   brimonidine 0.2 % ophthalmic solution Commonly known as:  ALPHAGAN Place into the right eye 2 (two) times daily.   docusate sodium 100 MG capsule Commonly known as:  COLACE Take 1 capsule (100 mg total) by mouth 2 (two) times daily.   ENDIT EX Apply 1 application topically. apply BID and PRN to areas of skin breakdown to buttocks   ENSURE ENLIVE PO Take 1 Bottle by mouth 2 (two) times daily.   feeding supplement (PRO-STAT SUGAR FREE 64) Liqd Take 30 mLs by mouth 2 (two) times daily.   HYDROcodone-acetaminophen 5-325 MG tablet Commonly known as:  NORCO/VICODIN Take 1 tablet by mouth every 4 (four) hours as needed for moderate pain (pain score 4-6).     ondansetron 4 MG tablet Commonly known as:  ZOFRAN Take 4 mg by mouth every 6 (six) hours as needed for nausea or vomiting.       Review of Systems  Constitutional: Negative for activity change, appetite change, chills, diaphoresis and fever.  HENT: Negative for congestion, mouth sores, nosebleeds, postnasal drip, sneezing, sore throat, trouble swallowing and  voice change.   Respiratory: Negative for apnea, cough, choking, chest tightness, shortness of breath and wheezing.   Cardiovascular: Negative for chest pain, palpitations and leg swelling.  Gastrointestinal: Negative for abdominal distention, abdominal pain, constipation, diarrhea and nausea.  Genitourinary: Negative for difficulty urinating, dysuria, frequency and urgency.  Musculoskeletal: Positive for arthralgias (typical arthritis) and gait problem. Negative for back pain and myalgias.  Skin: Positive for wound. Negative for color change, pallor and rash.  Neurological: Negative for dizziness, tremors, syncope, speech difficulty, weakness, numbness and headaches.  Psychiatric/Behavioral: Negative for agitation and behavioral problems.  All other systems reviewed and are negative.   Vitals:   03/04/18 1118  BP: (!) 157/52  Pulse: 72  Resp: 20  Temp: 98.2 F (36.8 C)  TempSrc: Oral  SpO2: 100%  Weight: 152 lb (68.9 kg)  Height: 5\' 6"  (1.676 m)   Body mass index is 24.53 kg/m. Physical Exam  Constitutional: She is oriented to person, place, and time. Vital signs are normal. She appears well-developed and well-nourished. She is active and cooperative. She does not appear ill. No distress.  HENT:  Head: Normocephalic and atraumatic.  Mouth/Throat: Uvula is midline, oropharynx is clear and moist and mucous membranes are normal. Mucous membranes are not pale, not dry and not cyanotic.  Eyes: Pupils are equal, round, and reactive to light. Conjunctivae, EOM and lids are normal.  Neck: Trachea normal, normal range of  motion and full passive range of motion without pain. Neck supple. No JVD present. No tracheal deviation, no edema and no erythema present. No thyromegaly present.  Cardiovascular: Normal rate, regular rhythm, normal heart sounds, intact distal pulses and normal pulses. Exam reveals no gallop, no distant heart sounds and no friction rub.  No murmur heard. Pulses:      Dorsalis pedis pulses are 2+ on the right side, and 2+ on the left side.  No edema  Pulmonary/Chest: Effort normal and breath sounds normal. No accessory muscle usage. No respiratory distress. She has no decreased breath sounds. She has no wheezes. She has no rhonchi. She has no rales. She exhibits no tenderness.  Abdominal: Soft. Normal appearance and bowel sounds are normal. She exhibits no distension and no ascites. There is no tenderness.  Musculoskeletal: She exhibits no edema.       Left hip: She exhibits decreased range of motion, decreased strength, tenderness and laceration.  Expected osteoarthritis, stiffness; Bilateral Calves soft, supple. Negative Homan's Sign. B- pedal pulses equal  Neurological: She is alert and oriented to person, place, and time. She has normal strength.  Skin: Skin is warm and dry. Laceration noted. She is not diaphoretic. No cyanosis. No pallor. Nails show no clubbing.  Psychiatric: She has a normal mood and affect. Her speech is normal and behavior is normal. Judgment and thought content normal. Cognition and memory are normal.  Nursing note and vitals reviewed.   Labs reviewed: Basic Metabolic Panel: Recent Labs    02/12/18 0415 02/13/18 0603 03/02/18 0615  NA 135 133* 141  K 3.9 4.2 4.2  CL 105 103 109  CO2 23 22 24   GLUCOSE 125* 108* 82  BUN 22* 25* 11  CREATININE 1.48* 0.90 0.48  CALCIUM 8.4* 8.4* 9.0   Liver Function Tests: Recent Labs    03/02/18 0615  AST 309*  ALT 324*  ALKPHOS 522*  BILITOT 1.4*  PROT 6.5  ALBUMIN 2.9*   No results for input(s): LIPASE, AMYLASE in  the last 8760 hours. No results for input(s):  AMMONIA in the last 8760 hours. CBC: Recent Labs    02/05/18 1617  02/12/18 0415 02/13/18 0603 03/02/18 0615  WBC 5.4   < > 10.0 8.3 3.7  NEUTROABS 3.1  --   --   --  2.2  HGB 12.2   < > 9.0* 8.7* 9.0*  HCT 36.9   < > 27.0* 25.8* 27.6*  MCV 95.4   < > 95.4 94.7 94.5  PLT 210   < > 162 173 438   < > = values in this interval not displayed.   Cardiac Enzymes: No results for input(s): CKTOTAL, CKMB, CKMBINDEX, TROPONINI in the last 8760 hours. BNP: Invalid input(s): POCBNP CBG: No results for input(s): GLUCAP in the last 8760 hours.  Procedures and Imaging Studies During Stay: Dg Hip Operative Unilat W Or W/o Pelvis Left  Result Date: 02/10/2018 CLINICAL DATA:  84 year old female for left hip replacement. Initial encounter. EXAM: OPERATIVE left HIP (WITH PELVIS IF PERFORMED) 2 VIEWS TECHNIQUE: Fluoroscopic spot image(s) were submitted for interpretation post-operatively. COMPARISON:  None. FINDINGS: Portion of the left hip prosthesis is imaged. The portions which is imaged appears in satisfactory position. Follow-up imaging recommended. IMPRESSION: Post left hip replacement incompletely assessed. Electronically Signed   By: Lacy Duverney M.D.   On: 02/10/2018 17:51   Dg Hip Unilat W Or W/o Pelvis 2-3 Views Left  Result Date: 02/10/2018 CLINICAL DATA:  Left hip arthroplasty. EXAM: DG HIP (WITH OR WITHOUT PELVIS) 2-3V LEFT COMPARISON:  Intraoperative radiographs from the same day. FINDINGS: Left hip hemiarthroplasty is present. The hip is located. Skin staples are in place. Advanced degenerative changes are present at the right hip. IMPRESSION: Left hip hemiarthroplasty without radiographic evidence for complication. Electronically Signed   By: Marin Roberts M.D.   On: 02/10/2018 18:16    Assessment/Plan:    Arthritis  Status post total hip replacement, left  Protein-calorie malnutrition, unspecified severity (HCC)   Continue  PT/OT  Continue exercises as taught by PT/OT  Continue ice to the hip prn for pain, edema  Continue Tylenol 325 mg po Q Day  Continue Norco 5/325 mg po Q 4 hours prn pain, #30, no refill  Continue Pro-stat 30 mL PO BID  Continue Ensure Enlive 1 bottle po BID  Skin care per protocol  Follow up with Orthopedist after discharge for continuity of care and medication management   Patient is being discharged with the following home health services: to be arranged by daughter in [place]   Patient is being discharged with the following durable medical equipment: none   Patient has been advised to f/u with their PCP in 1-2 weeks to bring them up to date on their rehab stay.  Social services at facility was responsible for arranging this appointment.  Pt was provided with a 30 day supply of prescriptions for medications and refills must be obtained from their PCP.  For controlled substances, a more limited supply may be provided adequate until PCP appointment only.  Future labs/tests needed:    Family/ staff Communication:   Total Time:  Documentation:  Face to Face:  Family/Phone:  Brynda Rim, NP-C Geriatrics [facility] [address], [place] [address] Cell Phone (Mon-Fri 8am-5pm):  [phone] On Call:  [phone] & follow prompts after 5pm & weekends Office Phone:  [phone] Office Fax:  [phone]

## 2018-06-11 ENCOUNTER — Encounter: Payer: Self-pay | Admitting: Podiatry

## 2018-06-11 ENCOUNTER — Ambulatory Visit (INDEPENDENT_AMBULATORY_CARE_PROVIDER_SITE_OTHER): Payer: Medicare Other | Admitting: Podiatry

## 2018-06-11 DIAGNOSIS — M79676 Pain in unspecified toe(s): Secondary | ICD-10-CM

## 2018-06-11 DIAGNOSIS — B351 Tinea unguium: Secondary | ICD-10-CM | POA: Diagnosis not present

## 2018-06-11 NOTE — Progress Notes (Signed)
Complaint:  Visit Type: Patient returns to my office for continued preventative foot care services. Complaint: Patient states" my nails have grown long and thick and become painful to walk and wear shoes" Patient has been experiencing pain in the back heels right foot. The patient presents for preventative foot care services. No changes to ROS  Podiatric Exam: Vascular: dorsalis pedis and posterior tibial pulses are palpable bilateral. Capillary return is immediate. Temperature gradient is WNL. Skin turgor WNL  Sensorium: Normal Semmes Weinstein monofilament test. Normal tactile sensation bilaterally. Nail Exam: Pt has thick disfigured discolored nails with subungual debris noted bilateral entire nail hallux through fifth toenails Ulcer Exam: There is no evidence of ulcer or pre-ulcerative changes or infection. Orthopedic Exam: Muscle tone and strength are WNL. No limitations in general ROM. No crepitus or effusions noted. Foot type and digits show no abnormalities. Bony prominences are unremarkable. Skin: No Porokeratosis. No infection or ulcers  Diagnosis:  Onychomycosis, , Pain in right toe, pain in left toes  Treatment & Plan Procedures and Treatment: Consent by patient was obtained for treatment procedures.   Debridement of mycotic and hypertrophic toenails, 1 through 5 bilateral and clearing of subungual debris. No ulceration, no infection noted.  Return Visit-Office Procedure: Patient instructed to return to the office for a follow up visit 3 months for continued evaluation and treatment.    Helane GuntherGregory Saadiya Wilfong DPM

## 2018-10-15 ENCOUNTER — Ambulatory Visit: Payer: Medicare Other | Admitting: Podiatry

## 2018-11-02 ENCOUNTER — Ambulatory Visit: Payer: Medicare Other | Admitting: Podiatry

## 2019-01-14 ENCOUNTER — Encounter: Payer: Self-pay | Admitting: Podiatry

## 2019-01-14 ENCOUNTER — Ambulatory Visit (INDEPENDENT_AMBULATORY_CARE_PROVIDER_SITE_OTHER): Payer: Medicare Other | Admitting: Podiatry

## 2019-01-14 DIAGNOSIS — M79676 Pain in unspecified toe(s): Secondary | ICD-10-CM | POA: Diagnosis not present

## 2019-01-14 DIAGNOSIS — B351 Tinea unguium: Secondary | ICD-10-CM | POA: Diagnosis not present

## 2019-01-14 NOTE — Progress Notes (Signed)
Complaint:  Visit Type: Patient returns to my office for continued preventative foot care services. Complaint: Patient states" my nails have grown long and thick and become painful to walk and wear shoes" Patient has been experiencing pain in the back heels right foot. The patient presents for preventative foot care services. No changes to ROS  Podiatric Exam: Vascular: dorsalis pedis and posterior tibial pulses are palpable bilateral. Capillary return is immediate. Temperature gradient is WNL. Skin turgor WNL  Sensorium: Normal Semmes Weinstein monofilament test. Normal tactile sensation bilaterally. Nail Exam: Pt has thick disfigured discolored nails with subungual debris noted bilateral entire nail hallux through fifth toenails Ulcer Exam: There is no evidence of ulcer or pre-ulcerative changes or infection. Orthopedic Exam: Muscle tone and strength are WNL. No limitations in general ROM. No crepitus or effusions noted. Foot type and digits show no abnormalities. Bony prominences are unremarkable. Skin: No Porokeratosis. No infection or ulcers  Diagnosis:  Onychomycosis, , Pain in right toe, pain in left toes  Treatment & Plan Procedures and Treatment: Consent by patient was obtained for treatment procedures.   Debridement of mycotic and hypertrophic toenails, 1 through 5 bilateral and clearing of subungual debris. No ulceration, no infection noted.  Return Visit-Office Procedure: Patient instructed to return to the office for a follow up visit 4 months for continued evaluation and treatment.    Helane Gunther DPM

## 2019-05-20 ENCOUNTER — Encounter: Payer: Self-pay | Admitting: Podiatry

## 2019-05-20 ENCOUNTER — Other Ambulatory Visit: Payer: Self-pay

## 2019-05-20 ENCOUNTER — Ambulatory Visit (INDEPENDENT_AMBULATORY_CARE_PROVIDER_SITE_OTHER): Payer: Medicare Other | Admitting: Podiatry

## 2019-05-20 DIAGNOSIS — M79674 Pain in right toe(s): Secondary | ICD-10-CM

## 2019-05-20 DIAGNOSIS — B351 Tinea unguium: Secondary | ICD-10-CM

## 2019-05-20 DIAGNOSIS — M79675 Pain in left toe(s): Secondary | ICD-10-CM

## 2019-05-20 NOTE — Progress Notes (Signed)
Complaint:  Visit Type: Patient returns to my office for continued preventative foot care services. Complaint: Patient states" my nails have grown long and thick and become painful to walk and wear shoes"  The patient presents for preventative foot care services. No changes to ROS  Podiatric Exam: Vascular: dorsalis pedis and posterior tibial pulses are palpable bilateral. Capillary return is immediate. Temperature gradient is WNL. Skin turgor WNL  Sensorium: Normal Semmes Weinstein monofilament test. Normal tactile sensation bilaterally. Nail Exam: Pt has thick disfigured discolored nails with subungual debris noted bilateral entire nail hallux through fifth toenails Ulcer Exam: There is no evidence of ulcer or pre-ulcerative changes or infection. Orthopedic Exam: Muscle tone and strength are WNL. No limitations in general ROM. No crepitus or effusions noted. Foot type and digits show no abnormalities. Bony prominences are unremarkable. Skin: No Porokeratosis. No infection or ulcers  Diagnosis:  Onychomycosis, , Pain in right toe, pain in left toes  Treatment & Plan Procedures and Treatment: Consent by patient was obtained for treatment procedures.   Debridement of mycotic and hypertrophic toenails, 1 through 5 bilateral and clearing of subungual debris. No ulceration, no infection noted.  Return Visit-Office Procedure: Patient instructed to return to the office for a follow up visit 4 months for continued evaluation and treatment.    Noboru Bidinger DPM 

## 2019-09-16 ENCOUNTER — Encounter: Payer: Self-pay | Admitting: Podiatry

## 2019-09-16 ENCOUNTER — Ambulatory Visit (INDEPENDENT_AMBULATORY_CARE_PROVIDER_SITE_OTHER): Payer: Medicare Other | Admitting: Podiatry

## 2019-09-16 ENCOUNTER — Other Ambulatory Visit: Payer: Self-pay

## 2019-09-16 DIAGNOSIS — M79675 Pain in left toe(s): Secondary | ICD-10-CM | POA: Diagnosis not present

## 2019-09-16 DIAGNOSIS — B351 Tinea unguium: Secondary | ICD-10-CM | POA: Diagnosis not present

## 2019-09-16 DIAGNOSIS — M79674 Pain in right toe(s): Secondary | ICD-10-CM | POA: Diagnosis not present

## 2019-09-16 NOTE — Progress Notes (Signed)
Complaint:  Visit Type: Patient returns to my office for continued preventative foot care services. Complaint: Patient states" my nails have grown long and thick and become painful to walk and wear shoes"  The patient presents for preventative foot care services. No changes to ROS  Podiatric Exam: Vascular: dorsalis pedis and posterior tibial pulses are palpable bilateral. Capillary return is immediate. Temperature gradient is WNL. Skin turgor WNL  Sensorium: Normal Semmes Weinstein monofilament test. Normal tactile sensation bilaterally. Nail Exam: Pt has thick disfigured discolored nails with subungual debris noted bilateral entire nail hallux through fifth toenails Ulcer Exam: There is no evidence of ulcer or pre-ulcerative changes or infection. Orthopedic Exam: Muscle tone and strength are WNL. No limitations in general ROM. No crepitus or effusions noted. Foot type and digits show no abnormalities. Bony prominences are unremarkable. Skin: No Porokeratosis. No infection or ulcers  Diagnosis:  Onychomycosis, , Pain in right toe, pain in left toes  Treatment & Plan Procedures and Treatment: Consent by patient was obtained for treatment procedures.   Debridement of mycotic and hypertrophic toenails, 1 through 5 bilateral and clearing of subungual debris. No ulceration, no infection noted.  Return Visit-Office Procedure: Patient instructed to return to the office for a follow up visit 4 months for continued evaluation and treatment.    Chanese Hartsough DPM 

## 2020-01-03 IMAGING — DX DG HIP (WITH OR WITHOUT PELVIS) 2-3V*L*
2 series · 2 of 2 positions shown · non-contrast
Comparison: Intraoperative radiographs from the same day.

CLINICAL DATA: Left hip arthroplasty.

EXAM:
DG HIP (WITH OR WITHOUT PELVIS) 2-3V LEFT

[pelvis ap]
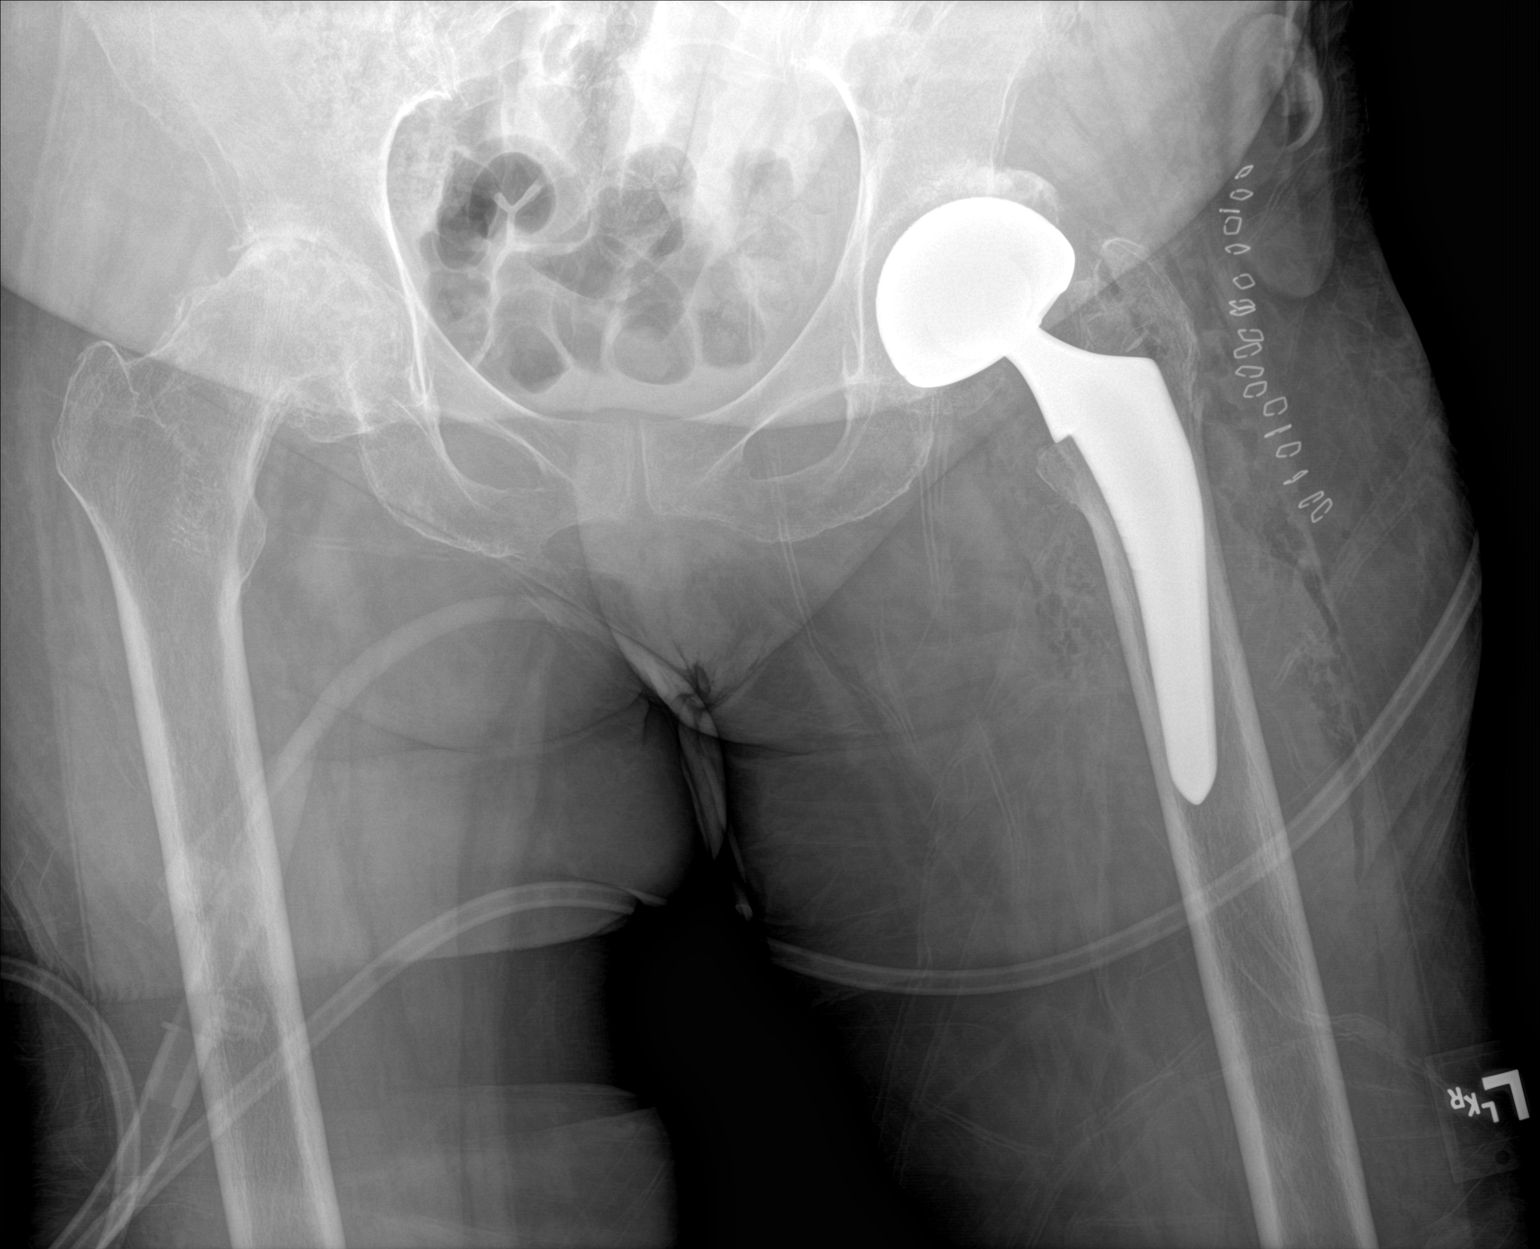

[hip lat]
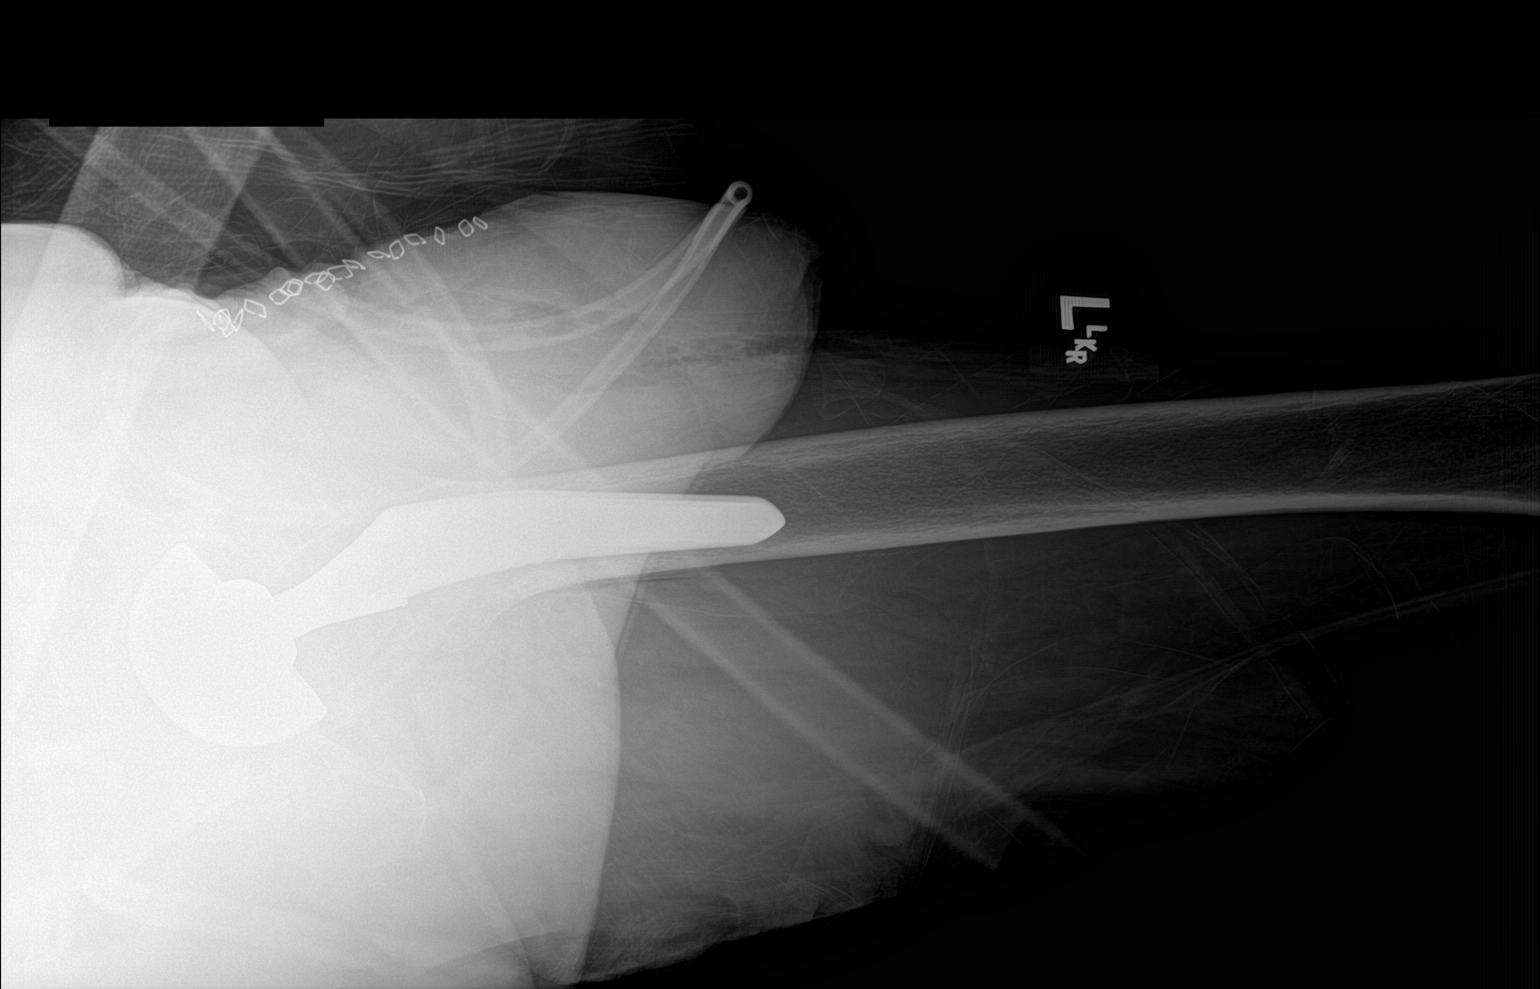

[2 of 2 positions shown; findings below may reference images not displayed]

FINDINGS: Left hip hemiarthroplasty is present. The hip is located. Skin
staples are in place. Advanced degenerative changes are present at
the right hip.
IMPRESSION: Left hip hemiarthroplasty without radiographic evidence for
complication.

## 2020-01-13 ENCOUNTER — Ambulatory Visit: Payer: Medicare Other | Admitting: Podiatry

## 2020-01-20 ENCOUNTER — Ambulatory Visit (INDEPENDENT_AMBULATORY_CARE_PROVIDER_SITE_OTHER): Payer: Medicare Other | Admitting: Podiatry

## 2020-01-20 ENCOUNTER — Other Ambulatory Visit: Payer: Self-pay

## 2020-01-20 ENCOUNTER — Encounter: Payer: Self-pay | Admitting: Podiatry

## 2020-01-20 DIAGNOSIS — B351 Tinea unguium: Secondary | ICD-10-CM

## 2020-01-20 DIAGNOSIS — M79674 Pain in right toe(s): Secondary | ICD-10-CM | POA: Diagnosis not present

## 2020-01-20 DIAGNOSIS — M79675 Pain in left toe(s): Secondary | ICD-10-CM | POA: Diagnosis not present

## 2020-01-20 NOTE — Progress Notes (Signed)
Complaint:  Visit Type: Patient returns to my office for continued preventative foot care services. Complaint: Patient states" my nails have grown long and thick and become painful to walk and wear shoes"  The patient presents for preventative foot care services. No changes to ROS  Podiatric Exam: Vascular: dorsalis pedis and posterior tibial pulses are palpable bilateral. Capillary return is immediate. Temperature gradient is WNL. Skin turgor WNL  Sensorium: Normal Semmes Weinstein monofilament test. Normal tactile sensation bilaterally. Nail Exam: Pt has thick disfigured discolored nails with subungual debris noted bilateral entire nail hallux through fifth toenails Ulcer Exam: There is no evidence of ulcer or pre-ulcerative changes or infection. Orthopedic Exam: Muscle tone and strength are WNL. No limitations in general ROM. No crepitus or effusions noted. Foot type and digits show no abnormalities. Bony prominences are unremarkable. Skin: No Porokeratosis. No infection or ulcers  Diagnosis:  Onychomycosis, , Pain in right toe, pain in left toes  Treatment & Plan Procedures and Treatment: Consent by patient was obtained for treatment procedures.   Debridement of mycotic and hypertrophic toenails, 1 through 5 bilateral and clearing of subungual debris. No ulceration, no infection noted.  Return Visit-Office Procedure: Patient instructed to return to the office for a follow up visit 4 months for continued evaluation and treatment.    Kihanna Kamiya DPM 

## 2020-05-25 ENCOUNTER — Encounter: Payer: Self-pay | Admitting: Podiatry

## 2020-05-25 ENCOUNTER — Ambulatory Visit (INDEPENDENT_AMBULATORY_CARE_PROVIDER_SITE_OTHER): Payer: Medicare Other | Admitting: Podiatry

## 2020-05-25 ENCOUNTER — Other Ambulatory Visit: Payer: Self-pay

## 2020-05-25 DIAGNOSIS — M79674 Pain in right toe(s): Secondary | ICD-10-CM | POA: Diagnosis not present

## 2020-05-25 DIAGNOSIS — B351 Tinea unguium: Secondary | ICD-10-CM

## 2020-05-25 DIAGNOSIS — M79675 Pain in left toe(s): Secondary | ICD-10-CM

## 2020-05-25 NOTE — Progress Notes (Signed)
This patient returns to the office for evaluation and treatment of long thick painful nails .  This patient is unable to trim ers own nails since the patient cannot reach her feet.  Patient says the nails are painful walking and wearing her shoes.  She returns for preventive foot care services.  General Appearance  Alert, conversant and in no acute stress.  Vascular  Dorsalis pedis and posterior tibial  pulses are palpable  bilaterally.  Capillary return is within normal limits  bilaterally. Temperature is within normal limits  bilaterally.  Neurologic  Senn-Weinstein monofilament wire test within normal limits  bilaterally. Muscle power within normal limits bilaterally.  Nails Thick disfigured discolored nails with subungual debris  from hallux to fifth toes bilaterally. No evidence of bacterial infection or drainage bilaterally.  Orthopedic  No limitations of motion  feet .  No crepitus or effusions noted.  No bony pathology or digital deformities noted.  Skin  normotropic skin with no porokeratosis noted bilaterally.  No signs of infections or ulcers noted.     Onychomycosis  Pain in toes right foot  Pain in toes left foot  Debridement  of nails  1-5  B/L with a nail nipper.  Nails were then filed using a dremel tool with no incidents.    RTC 3 months    Helane Gunther DPM

## 2020-08-28 ENCOUNTER — Ambulatory Visit: Payer: Medicare Other | Admitting: Podiatry

## 2020-08-31 ENCOUNTER — Encounter: Payer: Self-pay | Admitting: Podiatry

## 2020-08-31 ENCOUNTER — Ambulatory Visit (INDEPENDENT_AMBULATORY_CARE_PROVIDER_SITE_OTHER): Payer: Medicare Other | Admitting: Podiatry

## 2020-08-31 ENCOUNTER — Other Ambulatory Visit: Payer: Self-pay

## 2020-08-31 DIAGNOSIS — M79675 Pain in left toe(s): Secondary | ICD-10-CM

## 2020-08-31 DIAGNOSIS — M79674 Pain in right toe(s): Secondary | ICD-10-CM

## 2020-08-31 DIAGNOSIS — B351 Tinea unguium: Secondary | ICD-10-CM | POA: Diagnosis not present

## 2020-08-31 NOTE — Progress Notes (Signed)
This patient returns to the office for evaluation and treatment of long thick painful nails .  This patient is unable to trim ers own nails since the patient cannot reach her feet.  Patient says the nails are painful walking and wearing her shoes.  She returns for preventive foot care services.  General Appearance  Alert, conversant and in no acute stress.  Vascular  Dorsalis pedis and posterior tibial  pulses are palpable  bilaterally.  Capillary return is within normal limits  bilaterally. Temperature is within normal limits  bilaterally.  Neurologic  Senn-Weinstein monofilament wire test within normal limits  bilaterally. Muscle power within normal limits bilaterally.  Nails Thick disfigured discolored nails with subungual debris  from hallux to fifth toes bilaterally. No evidence of bacterial infection or drainage bilaterally.  Orthopedic  No limitations of motion  feet .  No crepitus or effusions noted.  No bony pathology or digital deformities noted.  Skin  normotropic skin with no porokeratosis noted bilaterally.  No signs of infections or ulcers noted.     Onychomycosis  Pain in toes right foot  Pain in toes left foot  Debridement  of nails  1-5  B/L with a nail nipper.  Nails were then filed using a dremel tool with no incidents.    RTC 3 months    Helane Gunther DPM

## 2020-12-07 ENCOUNTER — Ambulatory Visit: Payer: Medicare Other | Admitting: Podiatry

## 2021-02-12 ENCOUNTER — Ambulatory Visit: Payer: Medicare Other | Admitting: Podiatry

## 2021-06-07 ENCOUNTER — Encounter: Payer: Self-pay | Admitting: Podiatry

## 2021-06-07 ENCOUNTER — Ambulatory Visit (INDEPENDENT_AMBULATORY_CARE_PROVIDER_SITE_OTHER): Payer: Medicare Other | Admitting: Podiatry

## 2021-06-07 ENCOUNTER — Other Ambulatory Visit: Payer: Self-pay

## 2021-06-07 DIAGNOSIS — B351 Tinea unguium: Secondary | ICD-10-CM

## 2021-06-07 DIAGNOSIS — M79674 Pain in right toe(s): Secondary | ICD-10-CM

## 2021-06-07 DIAGNOSIS — M79675 Pain in left toe(s): Secondary | ICD-10-CM | POA: Diagnosis not present

## 2021-06-07 NOTE — Progress Notes (Signed)
This patient returns to the office for evaluation and treatment of long thick painful nails .  This patient is unable to trim ers own nails since the patient cannot reach her feet.  Patient says the nails are painful walking and wearing her shoes.  She returns for preventive foot care services.  Patient has not been seen on over 9 months.    General Appearance  Alert, conversant and in no acute stress.  Vascular  Dorsalis pedis and posterior tibial  pulses are palpable  bilaterally.  Capillary return is within normal limits  bilaterally. Temperature is within normal limits  bilaterally.  Neurologic  Senn-Weinstein monofilament wire test within normal limits  bilaterally. Muscle power within normal limits bilaterally.  Nails Thick disfigured discolored nails with subungual debris  from hallux to fifth toes bilaterally. No evidence of bacterial infection or drainage bilaterally.  Orthopedic  No limitations of motion  feet .  No crepitus or effusions noted.  No bony pathology or digital deformities noted.  Skin  normotropic skin with no porokeratosis noted bilaterally.  No signs of infections or ulcers noted.     Onychomycosis  Pain in toes right foot  Pain in toes left foot  Debridement  of nails  1-5  B/L with a nail nipper.  Nails were then filed using a dremel tool with no incidents.    RTC 3 months    Helane Gunther DPM

## 2021-09-20 ENCOUNTER — Ambulatory Visit: Payer: Medicare Other | Admitting: Podiatry

## 2021-12-07 DIAGNOSIS — Z23 Encounter for immunization: Secondary | ICD-10-CM | POA: Diagnosis not present

## 2021-12-28 ENCOUNTER — Other Ambulatory Visit: Payer: Self-pay

## 2021-12-28 ENCOUNTER — Ambulatory Visit (INDEPENDENT_AMBULATORY_CARE_PROVIDER_SITE_OTHER): Payer: Medicare Other | Admitting: Podiatry

## 2021-12-28 ENCOUNTER — Encounter: Payer: Self-pay | Admitting: Podiatry

## 2021-12-28 DIAGNOSIS — I872 Venous insufficiency (chronic) (peripheral): Secondary | ICD-10-CM

## 2021-12-28 MED ORDER — MUPIROCIN 2 % EX OINT: 1.0000 "application " | TOPICAL_OINTMENT | Freq: Two times a day (BID) | CUTANEOUS | 1 refills | Status: AC

## 2021-12-28 NOTE — Progress Notes (Signed)
HPI: 88 y.o. female presenting today for new complaint regarding discoloration and skin darkening to the bilateral feet.  Patient presents today with her son.  He says that he applies lotion to her bilateral feet and she also wears compression stockings.  Despite this they have noticed darkening of the skin.  There is no open wound but they are very concerned about it.  The son also states that he notices an odor.  He presents with his mother for further treatment evaluation  Past Medical History:  Diagnosis Date   Acute nasopharyngitis (common cold)    Allergic rhinitis    cause unspecified   Anemia    hx of b12 deficiency   Arthritis    Arthropathy, unspecified    Cataracts, bilateral    Headache    Hypertension    Obesity     Past Surgical History:  Procedure Laterality Date   ABDOMINAL HYSTERECTOMY  1970   CATARACT EXTRACTION Left 01/11/2015   CATARACT EXTRACTION Right    EYE SURGERY Left    cataract extraction   TOTAL HIP ARTHROPLASTY Left 02/10/2018   Procedure: TOTAL HIP ARTHROPLASTY ANTERIOR APPROACH;  Surgeon: Hessie Knows, MD;  Location: [facility] ORS;  Service: Orthopedics;  Laterality: Left;   TOTAL KNEE ARTHROPLASTY Right 04/06/2015   Procedure: TOTAL KNEE ARTHROPLASTY;  Surgeon: Hessie Knows, MD;  Location: [facility] ORS;  Service: Orthopedics;  Laterality: Right;    Allergies  Allergen Reactions   Penicillins Hives and Rash    Swelling with difficulty breathing   Celebrex [Celecoxib] Rash and Other (See Comments)    At higher dose Pt only gets a rash when it is a high dose.       Physical Exam: General: The patient is alert and oriented x3 in no acute distress.  Dermatology: Skin is warm, dry and supple bilateral lower extremities. Negative for open lesions or macerations.  No open wounds.  There is some dark discoloration of the scans at the level of the ankles and lower legs.  Likely due to venous insufficiency.  There is no malodor today upon  presentation  Vascular: Palpable pedal pulses bilaterally. Capillary refill within normal limits.  Negative for any significant edema or erythema.  No clinical concern for vascular compromise  Neurological: Light touch and protective threshold grossly intact  Musculoskeletal Exam: No pedal deformities noted   Assessment: 1.  Venous insufficiency bilateral with hemosiderin discoloration of the skin  Plan of Care:  1. Patient evaluated.   2.  Continue wearing compression hose daily.  I did explain to the patient and the son that I do believe she may be dealing with some venous insufficiency causing discoloration of the skin as well as age.  I did recommend an OTC topical cortisone cream to see if this would lighten the skin and reduce any, inflammatory reaction to the skin 3.  Prescription for mupirocin 2% ointment prescribed and sent to the pharmacy to apply topically which may help reduce any odor possibly due to bacterial colonization of the legs 4.  Return to clinic neck scheduled routine foot care appointment     Edrick Kins, DPM [facility]  Dr. Edrick Kins, DPM    [address], [place] [address]  Office [phone]  Fax [phone]

## 2022-02-14 ENCOUNTER — Ambulatory Visit (INDEPENDENT_AMBULATORY_CARE_PROVIDER_SITE_OTHER): Payer: Medicare Other | Admitting: Podiatry

## 2022-02-14 ENCOUNTER — Encounter: Payer: Self-pay | Admitting: Podiatry

## 2022-02-14 ENCOUNTER — Other Ambulatory Visit: Payer: Self-pay

## 2022-02-14 DIAGNOSIS — M79675 Pain in left toe(s): Secondary | ICD-10-CM

## 2022-02-14 DIAGNOSIS — B351 Tinea unguium: Secondary | ICD-10-CM | POA: Diagnosis not present

## 2022-02-14 DIAGNOSIS — I872 Venous insufficiency (chronic) (peripheral): Secondary | ICD-10-CM

## 2022-02-14 DIAGNOSIS — M79674 Pain in right toe(s): Secondary | ICD-10-CM | POA: Diagnosis not present

## 2022-02-14 NOTE — Progress Notes (Signed)
This patient returns to the office for evaluation and treatment of long thick painful nails .  This patient is unable to trim ers own nails since the patient cannot reach her feet.  Patient says the nails are painful walking and wearing her shoes.  She returns for preventive foot care services.  Patient has not been seen on over 9 months.   ? ?General Appearance  Alert, conversant and in no acute stress. ? ?Vascular  Dorsalis pedis and posterior tibial  pulses are palpable  bilaterally.  Capillary return is within normal limits  bilaterally. Temperature is within normal limits  bilaterally. ? ?Neurologic  Senn-Weinstein monofilament wire test within normal limits  bilaterally. Muscle power within normal limits bilaterally. ? ?Nails Thick disfigured discolored nails with subungual debris  from hallux to fifth toes bilaterally. No evidence of bacterial infection or drainage bilaterally. ? ?Orthopedic  No limitations of motion  feet .  No crepitus or effusions noted.  No bony pathology or digital deformities noted. ? ?Skin  normotropic skin with no porokeratosis noted bilaterally.  No signs of infections or ulcers noted.    ? ?Onychomycosis  Pain in toes right foot  Pain in toes left foot ? ?Debridement  of nails  1-5  B/L with a nail nipper.  Nails were then filed using a dremel tool with no incidents.    RTC 4  months  ? ? ?Helane Gunther DPM  ?

## 2022-06-20 ENCOUNTER — Ambulatory Visit: Payer: Medicare Other | Admitting: Podiatry

## 2022-06-27 ENCOUNTER — Encounter: Payer: Self-pay | Admitting: Podiatry

## 2022-06-27 ENCOUNTER — Ambulatory Visit (INDEPENDENT_AMBULATORY_CARE_PROVIDER_SITE_OTHER): Payer: Medicare Other | Admitting: Podiatry

## 2022-06-27 DIAGNOSIS — M79675 Pain in left toe(s): Secondary | ICD-10-CM

## 2022-06-27 DIAGNOSIS — M79674 Pain in right toe(s): Secondary | ICD-10-CM

## 2022-06-27 DIAGNOSIS — I872 Venous insufficiency (chronic) (peripheral): Secondary | ICD-10-CM

## 2022-06-27 DIAGNOSIS — B351 Tinea unguium: Secondary | ICD-10-CM

## 2022-06-27 NOTE — Progress Notes (Signed)
This patient returns to the office for evaluation and treatment of long thick painful nails .  This patient is unable to trim ers own nails since the patient cannot reach her feet.  Patient says the nails are painful walking and wearing her shoes.  She returns for preventive foot care services.    General Appearance  Alert, conversant and in no acute stress.  Vascular  Dorsalis pedis and posterior tibial  pulses are palpable  bilaterally.  Capillary return is within normal limits  bilaterally. Temperature is within normal limits  bilaterally.  Neurologic  Senn-Weinstein monofilament wire test within normal limits  bilaterally. Muscle power within normal limits bilaterally.  Nails Thick disfigured discolored nails with subungual debris  from hallux to fifth toes bilaterally. No evidence of bacterial infection or drainage bilaterally.  Orthopedic  No limitations of motion  feet .  No crepitus or effusions noted.  No bony pathology or digital deformities noted.  Skin  normotropic skin with no porokeratosis noted bilaterally.  No signs of infections or ulcers noted.     Onychomycosis  Pain in toes right foot  Pain in toes left foot  Debridement  of nails  1-5  B/L with a nail nipper.  Nails were not filed with dremel tool since she has nail polish on her nails.    RTC 3  months    Jarmel Linhardt DPM  

## 2022-10-03 ENCOUNTER — Ambulatory Visit (INDEPENDENT_AMBULATORY_CARE_PROVIDER_SITE_OTHER): Payer: Medicare Other | Admitting: Podiatry

## 2022-10-03 DIAGNOSIS — I872 Venous insufficiency (chronic) (peripheral): Secondary | ICD-10-CM

## 2022-10-03 DIAGNOSIS — M79675 Pain in left toe(s): Secondary | ICD-10-CM

## 2022-10-03 DIAGNOSIS — M79674 Pain in right toe(s): Secondary | ICD-10-CM

## 2022-10-03 DIAGNOSIS — B351 Tinea unguium: Secondary | ICD-10-CM

## 2022-10-03 NOTE — Progress Notes (Signed)
This patient returns to the office for evaluation and treatment of long thick painful nails .  This patient is unable to trim ers own nails since the patient cannot reach her feet.  Patient says the nails are painful walking and wearing her shoes.  She returns for preventive foot care services.    General Appearance  Alert, conversant and in no acute stress.  Vascular  Dorsalis pedis and posterior tibial  pulses are palpable  bilaterally.  Capillary return is within normal limits  bilaterally. Temperature is within normal limits  bilaterally.  Neurologic  Senn-Weinstein monofilament wire test within normal limits  bilaterally. Muscle power within normal limits bilaterally.  Nails Thick disfigured discolored nails with subungual debris  from hallux to fifth toes bilaterally. No evidence of bacterial infection or drainage bilaterally.  Orthopedic  No limitations of motion  feet .  No crepitus or effusions noted.  No bony pathology or digital deformities noted.  Skin  normotropic skin with no porokeratosis noted bilaterally.  No signs of infections or ulcers noted.     Onychomycosis  Pain in toes right foot  Pain in toes left foot  Debridement  of nails  1-5  B/L with a nail nipper.  Nails were not filed with dremel tool since she has nail polish on her nails.    RTC 3  months    Helane Gunther DPM

## 2022-12-04 ENCOUNTER — Ambulatory Visit (LOCAL_COMMUNITY_HEALTH_CENTER): Payer: Medicare Other

## 2022-12-04 ENCOUNTER — Ambulatory Visit: Payer: Medicare Other

## 2022-12-04 DIAGNOSIS — Z23 Encounter for immunization: Secondary | ICD-10-CM

## 2022-12-04 DIAGNOSIS — Z7185 Encounter for immunization safety counseling: Secondary | ICD-10-CM

## 2022-12-04 NOTE — Progress Notes (Signed)
  Are you feeling sick today? No   Have you ever received a dose of COVID-19 Vaccine? [employer], [place], [place], [place], Other) Yes  If yes, which vaccine and how many doses?   Pfizer and 5 doses   Did you bring the vaccination record card or other documentation?  Yes   Do you have a health condition or are undergoing treatment that makes you moderately or severely immunocompromised? This would include, but not be limited to: cancer, HIV, organ transplant, immunosuppressive therapy/high-dose corticosteroids, or moderate/severe primary immunodeficiency.  No  Have you received COVID-19 vaccine before or during hematopoietic cell transplant (HCT) or CAR-T-cell therapies? No  Have you ever had an allergic reaction to: (This would include a severe allergic reaction or a reaction that caused hives, swelling, or respiratory distress, including wheezing.) A component of a COVID-19 vaccine or a previous dose of COVID-19 vaccine? No   Have you ever had an allergic reaction to another vaccine (other thanCOVID-19 vaccine) or an injectable medication? (This would include a severe allergic reaction or a reaction that caused hives, swelling, or respiratory distress, including wheezing.)   No    Do you have a history of any of the following:  Myocarditis or Pericarditis No  Dermal fillers:  No  Multisystem Inflammatory Syndrome (MIS-C or MIS-A)? No  COVID-19 disease within the past 3 months? No  Vaccinated with monkeypox vaccine in the last 4 weeks? No  Pfizer Comirnaty [phone] (12 yrs+) given and High dose flu vaccine given. Tolerated well. Stayed for 15 min observation without problem. Patient left her walker at [facility] entrance and [facility] assisted patient (in wheelchair) to [facility] entrance  so that she could get her to get her walker before leaving. Josie Saunders, RN

## 2023-01-09 ENCOUNTER — Encounter: Payer: Self-pay | Admitting: Podiatry

## 2023-01-09 ENCOUNTER — Ambulatory Visit (INDEPENDENT_AMBULATORY_CARE_PROVIDER_SITE_OTHER): Payer: Medicare Other | Admitting: Podiatry

## 2023-01-09 VITALS — BP 151/60 | HR 68

## 2023-01-09 DIAGNOSIS — I872 Venous insufficiency (chronic) (peripheral): Secondary | ICD-10-CM

## 2023-01-09 DIAGNOSIS — M79674 Pain in right toe(s): Secondary | ICD-10-CM | POA: Diagnosis not present

## 2023-01-09 DIAGNOSIS — M79675 Pain in left toe(s): Secondary | ICD-10-CM

## 2023-01-09 DIAGNOSIS — B351 Tinea unguium: Secondary | ICD-10-CM | POA: Diagnosis not present

## 2023-01-09 NOTE — Progress Notes (Signed)
This patient returns to the office for evaluation and treatment of long thick painful nails .  This patient is unable to trim h ers own nails since the patient cannot reach her feet.  Patient says the nails are painful walking and wearing her shoes.  She returns for preventive foot care services.    General Appearance  Alert, conversant and in no acute stress.  Vascular  Dorsalis pedis and posterior tibial  pulses are  weakly palpable  bilaterally due to swelling.  Capillary return is within normal limits  bilaterally. Temperature is within normal limits  bilaterally.  Neurologic  Senn-Weinstein monofilament wire test within normal limits  bilaterally. Muscle power within normal limits bilaterally.  Nails Thick disfigured discolored nails with subungual debris  from hallux to fifth toes bilaterally. No evidence of bacterial infection or drainage bilaterally.  Orthopedic  No limitations of motion  feet .  No crepitus or effusions noted.  No bony pathology or digital deformities noted.  Skin  normotropic skin with no porokeratosis noted bilaterally.  No signs of infections or ulcers noted.     Onychomycosis  Pain in toes right foot  Pain in toes left foot  Debridement  of nails  1-5  B/L with a nail nipper.  Nails were not filed with dremel tool since she has nail polish on her nails.    RTC 3  months    Gardiner Barefoot DPM

## 2023-04-10 ENCOUNTER — Ambulatory Visit: Payer: Medicare Other | Admitting: Podiatry

## 2023-07-21 ENCOUNTER — Ambulatory Visit (INDEPENDENT_AMBULATORY_CARE_PROVIDER_SITE_OTHER): Payer: Medicare Other | Admitting: Podiatry

## 2023-07-21 DIAGNOSIS — B351 Tinea unguium: Secondary | ICD-10-CM | POA: Diagnosis not present

## 2023-07-21 DIAGNOSIS — M79674 Pain in right toe(s): Secondary | ICD-10-CM

## 2023-07-21 DIAGNOSIS — M79675 Pain in left toe(s): Secondary | ICD-10-CM | POA: Diagnosis not present

## 2023-07-21 NOTE — Progress Notes (Signed)
  Subjective:  Patient ID: Whitney Mcneil, female    DOB: May 11, 1933,  MRN: [id number]  Whitney Mcneil presents to clinic today for: painful elongated mycotic toenails 1-5 bilaterally which are tender when wearing enclosed shoe gear. Pain is relieved with periodic professional debridement. Patient states she is seeing wound care for wound RLE. She is accompanied by a family member on today's visit. Chief Complaint  Patient presents with   Nail Problem    RFC,Referring Provider Gracelyn Nurse, MD,lov:07/24         PCP is Gracelyn Nurse, MD.  Allergies  Allergen Reactions   Penicillins Hives and Rash    Swelling with difficulty breathing   Celebrex [Celecoxib] Rash and Other (See Comments)    At higher dose Pt only gets a rash when it is a high dose.      Review of Systems: Negative except as noted in the HPI.  Objective: No changes noted in today's physical examination. There were no vitals filed for this visit.  Whitney Mcneil is a pleasant 89 y.o. female in NAD. AAO x 3.  Vascular Examination: Capillary refill time <3 seconds b/l LE. Faintly palpable pedal pulses b/l LE. Digital hair absent b/l. +Pedal edema b/l. Skin temperature gradient WNL b/l. No varicosities b/l. Marland Kitchen  Dermatological Examination: Pedal skin with normal turgor, texture and tone b/l. No open wounds. No interdigital macerations b/l. Toenails 1-5 b/l thickened, discolored, dystrophic with subungual debris. There is pain on palpation to dorsal aspect of nailplates. No corns, calluses nor porokeratotic lesions noted..  Neurological Examination: Protective sensation intact with 10 gram monofilament b/l LE. Vibratory sensation intact b/l LE.   Musculoskeletal Examination: Muscle strength 5/5 to all lower extremity muscle groups bilaterally. Utilizes walker for ambulation assistance.  Assessment/Plan: 1. Pain due to onychomycosis of toenails of both feet     -Patient's family member present. All  questions/concerns addressed on today's visit. -Patient to continue soft, supportive shoe gear daily. -Toenails 1-5 b/l were debrided in length and girth with sterile nail nippers and dremel without iatrogenic bleeding.  -Patient/POA to call should there be question/concern in the interim.   Return in about 3 months (around 10/21/2023).  Freddie Breech, DPM

## 2023-07-26 ENCOUNTER — Encounter: Payer: Self-pay | Admitting: Podiatry

## 2023-10-30 ENCOUNTER — Ambulatory Visit: Payer: Medicare Other | Admitting: Podiatry

## 2023-12-17 ENCOUNTER — Encounter: Payer: Self-pay | Admitting: Podiatry

## 2023-12-17 ENCOUNTER — Ambulatory Visit (INDEPENDENT_AMBULATORY_CARE_PROVIDER_SITE_OTHER): Payer: Medicare Other | Admitting: Podiatry

## 2023-12-17 DIAGNOSIS — B351 Tinea unguium: Secondary | ICD-10-CM | POA: Diagnosis not present

## 2023-12-17 DIAGNOSIS — M79674 Pain in right toe(s): Secondary | ICD-10-CM

## 2023-12-17 DIAGNOSIS — M79675 Pain in left toe(s): Secondary | ICD-10-CM

## 2023-12-18 NOTE — Progress Notes (Signed)
  Subjective:  Patient ID: Whitney Mcneil, female    DOB: [birth year],  MRN: [id number]  Chief Complaint  Patient presents with   Nail Problem    "My toenails need to be cut."    89 y.o. female presents with the above complaint. History confirmed with patient.   Objective:  Physical Exam: warm, good capillary refill, no trophic changes or ulcerative lesions, normal DP and PT pulses, and normal sensory exam. Left Foot: dystrophic yellowed discolored nail plates with subungual debris Right Foot: dystrophic yellowed discolored nail plates with subungual debris   Assessment:   1. Pain due to onychomycosis of toenails of both feet      Plan:  Patient was evaluated and treated and all questions answered.  Discussed the etiology and treatment options for the condition in detail with the patient.  Recommended debridement of the nails today. Sharp and mechanical debridement performed of all painful and mycotic nails today. Nails debrided in length and thickness using a nail nipper to level of comfort.    Return in about 3 months (around 03/16/2024) for painful thick fungal nails.

## 2024-03-18 ENCOUNTER — Ambulatory Visit (INDEPENDENT_AMBULATORY_CARE_PROVIDER_SITE_OTHER): Payer: Medicare Other | Admitting: Podiatry

## 2024-03-18 ENCOUNTER — Encounter: Payer: Self-pay | Admitting: Podiatry

## 2024-03-18 DIAGNOSIS — M79674 Pain in right toe(s): Secondary | ICD-10-CM | POA: Diagnosis not present

## 2024-03-18 DIAGNOSIS — B351 Tinea unguium: Secondary | ICD-10-CM | POA: Diagnosis not present

## 2024-03-18 DIAGNOSIS — M79675 Pain in left toe(s): Secondary | ICD-10-CM | POA: Diagnosis not present

## 2024-03-19 ENCOUNTER — Telehealth: Payer: Self-pay | Admitting: Podiatry

## 2024-03-19 NOTE — Telephone Encounter (Signed)
 Pt was seen on 12/17/23 and said some cream was supposed to be sent into [facility] and they did not. Requesting to have it re-sent.

## 2024-03-24 NOTE — Progress Notes (Signed)
  Subjective:  Patient ID: Whitney Mcneil, female    DOB: [birth year],  MRN: [id number]  89 y.o. female presents painful, elongated thickened toenails x 10 which are symptomatic when wearing enclosed shoe gear. This interferes with his/her daily activities. Chief Complaint  Patient presents with   Nail Problem    "Toenails"   New problem(s): None   PCP is Little Riff, MD.  Allergies  Allergen Reactions   Penicillins Hives and Rash    Swelling with difficulty breathing   Celebrex  [Celecoxib ] Rash and Other (See Comments)    At higher dose Pt only gets a rash when it is a high dose.      Review of Systems: Negative except as noted in the HPI.   Objective:  Whitney Mcneil is a pleasant 89 y.o. female in NAD. AAO x 3.  Vascular Examination: Vascular status intact b/l with palpable pedal pulses. CFT immediate b/l. Pedal hair present. No edema. No pain with calf compression b/l. Skin temperature gradient WNL b/l. No varicosities noted. No cyanosis or clubbing noted.  Neurological Examination: Sensation grossly intact b/l with 10 gram monofilament. Vibratory sensation intact b/l.  Dermatological Examination: Pedal skin with normal turgor, texture and tone b/l. No open wounds nor interdigital macerations noted. Toenails 1-5 b/l thick, discolored, elongated with subungual debris and pain on dorsal palpation. No hyperkeratotic lesions noted b/l.   Musculoskeletal Examination: Muscle strength 5/5 to all lower extremity muscle groups bilaterally. Utilizes walker for ambulation assistance.  Radiographs: None  Last A1c:       No data to display           Assessment:   1. Pain due to onychomycosis of toenails of both feet    Plan:  Consent given for treatment. Patient examined. All patient's and/or POA's questions/concerns addressed on today's visit. Mycotic toenails 1-5 debrided in length and girth without incident. Continue soft, supportive shoe gear daily. Report any  pedal injuries to medical professional. Call office if there are any quesitons/concerns. -Patient/POA to call should there be question/concern in the interim.  Return in about 3 months (around 06/17/2024).  Luella Sager, DPM      [place] LOCATION: [address], [place] [address]                   Office [phone]   [facility] LOCATION: [address] [place], [place] [address] Office [phone]

## 2024-04-26 ENCOUNTER — Ambulatory Visit (LOCAL_COMMUNITY_HEALTH_CENTER)

## 2024-04-26 DIAGNOSIS — Z7185 Encounter for immunization safety counseling: Secondary | ICD-10-CM

## 2024-04-26 DIAGNOSIS — Z23 Encounter for immunization: Secondary | ICD-10-CM

## 2024-04-26 MED ORDER — INFLUENZA VAC SPLIT HIGH-DOSE 0.5 ML IM SUSY: 0.5000 mL | PREFILLED_SYRINGE | Freq: Once | INTRAMUSCULAR | 0 refills | Status: DC

## 2024-04-26 MED ORDER — FLUZONE HIGH-DOSE 0.5 ML IM SUSY: 0.5000 mL | PREFILLED_SYRINGE | Freq: Once | INTRAMUSCULAR | 0 refills | Status: DC

## 2024-04-26 NOTE — Progress Notes (Signed)
 In nurse clinic requesting Flu (high dose) and Shingrix today. Patient states she has never had Shingrix vaccine and has not had flu vaccine this season.   Counseled on recommended vaccines. High dose flu and Shingrix given and tolerated well. Updated [facility] copy given and recommended schedule reviewed.   Patient arrived to nurse clinic in wheel chair. States [facility] brought her to appt today and she left her walker on ground floor and was escorted in wheel chair to health dept floor. Once visit completed, RN escorted patient to ground floor, h dept entrance,  and clerk notified that patient is waiting for [facility] transportation. Gerrad Welker, RN

## 2024-06-17 ENCOUNTER — Encounter: Payer: Self-pay | Admitting: Podiatry

## 2024-06-17 ENCOUNTER — Ambulatory Visit (INDEPENDENT_AMBULATORY_CARE_PROVIDER_SITE_OTHER): Admitting: Podiatry

## 2024-06-17 DIAGNOSIS — B351 Tinea unguium: Secondary | ICD-10-CM | POA: Diagnosis not present

## 2024-06-17 DIAGNOSIS — M79675 Pain in left toe(s): Secondary | ICD-10-CM | POA: Diagnosis not present

## 2024-06-17 DIAGNOSIS — M79674 Pain in right toe(s): Secondary | ICD-10-CM | POA: Diagnosis not present

## 2024-06-17 NOTE — Progress Notes (Signed)
  Subjective:  Patient ID: Whitney Mcneil, female    DOB: [birth year],  MRN: [id number]  89 y.o. female presents to clinic with  painful thick toenails that are difficult to trim. Pain interferes with ambulation. Aggravating factors include wearing enclosed shoe gear. Pain is relieved with periodic professional debridement.  Chief Complaint  Patient presents with   Nail Problem    Thick painful toenails, 3 month follow up     New problem(s): None   PCP is Rudolpho Norleen BIRCH, MD.  Allergies  Allergen Reactions   Penicillins Hives and Rash    Swelling with difficulty breathing   Celebrex  [Celecoxib ] Rash and Other (See Comments)    At higher dose Pt only gets a rash when it is a high dose.      Review of Systems: Negative except as noted in the HPI.   Objective:  Whitney Mcneil is a pleasant 89 y.o. female in NAD. AAO x 3.  Vascular Examination: Vascular status intact b/l with palpable pedal pulses. CFT immediate b/l. No edema. No pain with calf compression b/l. Skin temperature gradient WNL b/l. No edema noted b/l LE.  Neurological Examination: Sensation grossly intact b/l with 10 gram monofilament. Vibratory sensation intact b/l.   Dermatological Examination: Pedal skin with normal turgor, texture and tone b/l. Toenails 1-5 b/l thick, discolored, elongated with subungual debris and pain on dorsal palpation. No corns, calluses nor porokeratotic lesions noted.  Musculoskeletal Examination: Muscle strength 5/5 to b/l LE. Muscle strength 5/5 to all lower extremity muscle groups bilaterally. Utilizes walker for ambulation assistance.  Radiographs: None   Assessment:   1. Pain due to onychomycosis of toenails of both feet     Plan:  Patient was evaluated and treated. All patient's and/or POA's questions/concerns addressed on today's visit. Toenails 1-5 debrided in length and girth without incident. Continue soft, supportive shoe gear daily. Report any pedal injuries to medical  professional. Call office if there are any questions/concerns. -Patient/POA to call should there be question/concern in the interim.  Return in about 3 months (around 09/17/2024).  Delon LITTIE Merlin, DPM      [place] LOCATION: [address], [place] [address]                   Office [phone]   [facility] LOCATION: [address] [place], [place] [address] Office [phone]

## 2024-09-23 ENCOUNTER — Ambulatory Visit: Admitting: Podiatry

## 2024-09-23 DIAGNOSIS — M79675 Pain in left toe(s): Secondary | ICD-10-CM | POA: Diagnosis not present

## 2024-09-23 DIAGNOSIS — B351 Tinea unguium: Secondary | ICD-10-CM

## 2024-09-23 DIAGNOSIS — M79674 Pain in right toe(s): Secondary | ICD-10-CM

## 2024-09-23 NOTE — Progress Notes (Signed)
  Subjective:  Patient ID: Whitney Mcneil, female    DOB: 04-03-1933,  MRN: [id number]  Whitney Mcneil presents to clinic today for: preventative diabetic foot care for painful mycotic toenails x 10 which interfere with daily activities. Pain is relieved with periodic professional debridement.  Chief Complaint  Patient presents with   Toe Pain    She saw Dr. Rudolpho in August.     PCP is Rudolpho Norleen BIRCH, MD.  Allergies  Allergen Reactions   Penicillins Hives and Rash    Swelling with difficulty breathing   Celebrex  [Celecoxib ] Rash and Other (See Comments)    At higher dose Pt only gets a rash when it is a high dose.      Review of Systems: Negative except as noted in the HPI.  Objective: No changes noted in today's physical examination. There were no vitals filed for this visit.  Whitney Mcneil is a pleasant 89 y.o. female in NAD. AAO x 3.  Vascular Examination: Capillary refill time <3 seconds b/l LE. Palpable pedal pulses b/l LE. Digital hair present b/l. No pedal edema b/l. Skin temperature gradient WNL b/l. No varicosities b/l. No cyanosis or clubbing. No ischemia or gangrene. Nonpitting edema bilaterally.  Dermatological Examination: Pedal skin with normal turgor, texture and tone b/l. No open wounds. No interdigital macerations b/l. Toenails 1-5 b/l thickened, discolored, dystrophic with subungual debris. There is pain on palpation to dorsal aspect of nailplates. No hyperkeratotic nor porokeratotic lesions.   Neurological Examination: Protective sensation intact with 10 gram monofilament b/l LE. Vibratory sensation intact b/l LE.   Musculoskeletal Examination: Muscle strength 5/5 to all lower extremity muscle groups bilaterally. No pain, crepitus or joint limitation noted with ROM b/l LE. No gross bony pedal deformities b/l. Patient ambulates with walker assistance.  Assessment/Plan: 1. Pain due to onychomycosis of toenails of both feet    Consent given for  treatment. Patient examined. All patient's and/or POA's questions/concerns addressed on today's visit. Mycotic toenails 1-5 b/l debrided in length and girth without incident. Continue soft, supportive shoe gear daily. Report any pedal injuries to medical professional. Call office if there are any quesitons/concerns. -Patient/POA to call should there be question/concern in the interim.   Return in about 3 months (around 12/24/2024).  Delon LITTIE Merlin, DPM      [place] LOCATION: [address], [place] [address]                   Office [phone]   [facility] LOCATION: [address] [place], [place] [address] Office [phone]

## 2024-09-26 ENCOUNTER — Encounter: Payer: Self-pay | Admitting: Podiatry

## 2025-01-06 ENCOUNTER — Ambulatory Visit: Admitting: Podiatry
